# Patient Record
Sex: Female | Born: 1955 | Race: Black or African American | Hispanic: No | Marital: Single | Smoking: Current some day smoker
Health system: Southern US, Community
[De-identification: ages and names within clinical notes are randomized; demographics above are authoritative.]

## PROBLEM LIST (undated history)

## (undated) DIAGNOSIS — B1920 Unspecified viral hepatitis C without hepatic coma: Secondary | ICD-10-CM

## (undated) DIAGNOSIS — F209 Schizophrenia, unspecified: Secondary | ICD-10-CM

## (undated) DIAGNOSIS — B20 Human immunodeficiency virus [HIV] disease: Secondary | ICD-10-CM

## (undated) DIAGNOSIS — E669 Obesity, unspecified: Secondary | ICD-10-CM

## (undated) DIAGNOSIS — Z21 Asymptomatic human immunodeficiency virus [HIV] infection status: Secondary | ICD-10-CM

---

## 2008-09-03 ENCOUNTER — Emergency Department: Payer: Self-pay | Admitting: Emergency Medicine

## 2009-04-30 ENCOUNTER — Emergency Department: Payer: Self-pay | Admitting: Emergency Medicine

## 2009-05-28 ENCOUNTER — Ambulatory Visit: Payer: Self-pay | Admitting: Family Medicine

## 2009-07-04 ENCOUNTER — Ambulatory Visit: Payer: Self-pay | Admitting: Gastroenterology

## 2010-06-26 ENCOUNTER — Ambulatory Visit: Payer: Self-pay | Admitting: Family Medicine

## 2011-02-13 ENCOUNTER — Emergency Department: Payer: Self-pay | Admitting: Emergency Medicine

## 2011-07-03 ENCOUNTER — Ambulatory Visit: Payer: Self-pay | Admitting: Family Medicine

## 2012-02-09 DIAGNOSIS — Z720 Tobacco use: Secondary | ICD-10-CM | POA: Insufficient documentation

## 2012-02-09 DIAGNOSIS — Z21 Asymptomatic human immunodeficiency virus [HIV] infection status: Secondary | ICD-10-CM | POA: Insufficient documentation

## 2012-03-10 DIAGNOSIS — E669 Obesity, unspecified: Secondary | ICD-10-CM | POA: Insufficient documentation

## 2014-02-05 ENCOUNTER — Ambulatory Visit: Payer: Self-pay | Admitting: Family Medicine

## 2014-03-01 ENCOUNTER — Ambulatory Visit: Payer: Self-pay | Admitting: Family Medicine

## 2014-03-22 DIAGNOSIS — Z79899 Other long term (current) drug therapy: Secondary | ICD-10-CM | POA: Insufficient documentation

## 2014-09-23 ENCOUNTER — Emergency Department: Payer: Self-pay | Admitting: Emergency Medicine

## 2014-09-23 LAB — URINALYSIS, COMPLETE
BILIRUBIN, UR: NEGATIVE
Blood: NEGATIVE
Glucose,UR: NEGATIVE mg/dL (ref 0–75)
Ketone: NEGATIVE
Leukocyte Esterase: NEGATIVE
Nitrite: NEGATIVE
PROTEIN: NEGATIVE
Ph: 6 (ref 4.5–8.0)
Specific Gravity: 1.015 (ref 1.003–1.030)
Squamous Epithelial: 3
WBC UR: 2 /HPF (ref 0–5)

## 2014-09-23 LAB — CBC
HCT: 43.1 % (ref 35.0–47.0)
HGB: 13.9 g/dL (ref 12.0–16.0)
MCH: 31.1 pg (ref 26.0–34.0)
MCHC: 32.3 g/dL (ref 32.0–36.0)
MCV: 96 fL (ref 80–100)
Platelet: 186 10*3/uL (ref 150–440)
RBC: 4.48 10*6/uL (ref 3.80–5.20)
RDW: 12.8 % (ref 11.5–14.5)
WBC: 7.8 10*3/uL (ref 3.6–11.0)

## 2014-09-23 LAB — COMPREHENSIVE METABOLIC PANEL
ALT: 24 U/L
ANION GAP: 10 (ref 7–16)
AST: 25 U/L (ref 15–37)
Albumin: 3.5 g/dL (ref 3.4–5.0)
Alkaline Phosphatase: 92 U/L
BILIRUBIN TOTAL: 0.1 mg/dL — AB (ref 0.2–1.0)
BUN: 11 mg/dL (ref 7–18)
CHLORIDE: 106 mmol/L (ref 98–107)
CO2: 26 mmol/L (ref 21–32)
CREATININE: 0.9 mg/dL (ref 0.60–1.30)
Calcium, Total: 9.8 mg/dL (ref 8.5–10.1)
Glucose: 119 mg/dL — ABNORMAL HIGH (ref 65–99)
Osmolality: 284 (ref 275–301)
POTASSIUM: 4 mmol/L (ref 3.5–5.1)
Sodium: 142 mmol/L (ref 136–145)
TOTAL PROTEIN: 7.7 g/dL (ref 6.4–8.2)

## 2014-09-23 LAB — LIPASE, BLOOD: Lipase: 85 U/L (ref 73–393)

## 2015-06-13 ENCOUNTER — Other Ambulatory Visit: Payer: Self-pay | Admitting: Family Medicine

## 2015-06-13 ENCOUNTER — Ambulatory Visit
Admission: RE | Admit: 2015-06-13 | Discharge: 2015-06-13 | Disposition: A | Discharge status: Home / Self Care | Payer: Medicare Other | Source: Ambulatory Visit | Attending: Family Medicine | Admitting: Family Medicine

## 2015-06-13 DIAGNOSIS — R918 Other nonspecific abnormal finding of lung field: Secondary | ICD-10-CM

## 2015-06-13 DIAGNOSIS — F17200 Nicotine dependence, unspecified, uncomplicated: Secondary | ICD-10-CM | POA: Diagnosis not present

## 2015-12-18 ENCOUNTER — Other Ambulatory Visit: Payer: Self-pay | Admitting: Preventative Medicine

## 2015-12-18 DIAGNOSIS — Z1231 Encounter for screening mammogram for malignant neoplasm of breast: Secondary | ICD-10-CM

## 2016-01-01 ENCOUNTER — Other Ambulatory Visit: Payer: Self-pay | Admitting: Preventative Medicine

## 2016-01-01 ENCOUNTER — Ambulatory Visit
Admission: RE | Admit: 2016-01-01 | Discharge: 2016-01-01 | Disposition: A | Discharge status: Home / Self Care | Payer: Medicare Other | Source: Ambulatory Visit | Attending: Preventative Medicine | Admitting: Preventative Medicine

## 2016-01-01 DIAGNOSIS — R918 Other nonspecific abnormal finding of lung field: Secondary | ICD-10-CM

## 2016-01-01 DIAGNOSIS — Z1231 Encounter for screening mammogram for malignant neoplasm of breast: Secondary | ICD-10-CM

## 2016-01-09 ENCOUNTER — Other Ambulatory Visit: Payer: Self-pay | Admitting: Preventative Medicine

## 2016-01-09 DIAGNOSIS — R928 Other abnormal and inconclusive findings on diagnostic imaging of breast: Secondary | ICD-10-CM

## 2016-01-29 ENCOUNTER — Ambulatory Visit
Admission: RE | Admit: 2016-01-29 | Discharge: 2016-01-29 | Disposition: A | Discharge status: Home / Self Care | Payer: Medicare Other | Source: Ambulatory Visit | Attending: Preventative Medicine | Admitting: Preventative Medicine

## 2016-01-29 ENCOUNTER — Other Ambulatory Visit: Payer: Self-pay | Admitting: Preventative Medicine

## 2016-01-29 DIAGNOSIS — R928 Other abnormal and inconclusive findings on diagnostic imaging of breast: Secondary | ICD-10-CM | POA: Insufficient documentation

## 2016-05-28 DIAGNOSIS — Z9189 Other specified personal risk factors, not elsewhere classified: Secondary | ICD-10-CM | POA: Insufficient documentation

## 2016-07-15 ENCOUNTER — Emergency Department
Admission: EM | Admit: 2016-07-15 | Discharge: 2016-07-15 | Disposition: A | Discharge status: Home / Self Care | Payer: Medicare Other | Attending: Emergency Medicine | Admitting: Emergency Medicine

## 2016-07-15 DIAGNOSIS — Z21 Asymptomatic human immunodeficiency virus [HIV] infection status: Secondary | ICD-10-CM | POA: Diagnosis not present

## 2016-07-15 DIAGNOSIS — F419 Anxiety disorder, unspecified: Secondary | ICD-10-CM | POA: Insufficient documentation

## 2016-07-15 DIAGNOSIS — Z79899 Other long term (current) drug therapy: Secondary | ICD-10-CM | POA: Diagnosis not present

## 2016-07-15 DIAGNOSIS — F209 Schizophrenia, unspecified: Secondary | ICD-10-CM

## 2016-07-15 DIAGNOSIS — F200 Paranoid schizophrenia: Secondary | ICD-10-CM

## 2016-07-15 HISTORY — DX: Unspecified viral hepatitis C without hepatic coma: B19.20

## 2016-07-15 HISTORY — DX: Obesity, unspecified: E66.9

## 2016-07-15 HISTORY — DX: Human immunodeficiency virus (HIV) disease: B20

## 2016-07-15 HISTORY — DX: Schizophrenia, unspecified: F20.9

## 2016-07-15 HISTORY — DX: Asymptomatic human immunodeficiency virus (hiv) infection status: Z21

## 2016-07-15 LAB — COMPREHENSIVE METABOLIC PANEL
ALT: 17 U/L (ref 14–54)
AST: 21 U/L (ref 15–41)
Albumin: 4 g/dL (ref 3.5–5.0)
Alkaline Phosphatase: 60 U/L (ref 38–126)
Anion gap: 7 (ref 5–15)
BUN: 15 mg/dL (ref 6–20)
CALCIUM: 10.3 mg/dL (ref 8.9–10.3)
CHLORIDE: 106 mmol/L (ref 101–111)
CO2: 24 mmol/L (ref 22–32)
CREATININE: 0.85 mg/dL (ref 0.44–1.00)
GFR calc non Af Amer: >60 mL/min (ref >60)
Glucose, Bld: 92 mg/dL (ref 65–99)
POTASSIUM: 4.3 mmol/L (ref 3.5–5.1)
SODIUM: 137 mmol/L (ref 135–145)
TOTAL PROTEIN: 7.4 g/dL (ref 6.5–8.1)
Total Bilirubin: 0.2 mg/dL — ABNORMAL LOW (ref 0.3–1.2)

## 2016-07-15 LAB — CBC
HCT: 42.6 % (ref 35.0–47.0)
Hemoglobin: 14.4 g/dL (ref 12.0–16.0)
MCH: 31.5 pg (ref 26.0–34.0)
MCHC: 33.7 g/dL (ref 32.0–36.0)
MCV: 93.6 fL (ref 80.0–100.0)
PLATELETS: 200 10*3/uL (ref 150–440)
RBC: 4.55 MIL/uL (ref 3.80–5.20)
RDW: 13 % (ref 11.5–14.5)
WBC: 6.8 10*3/uL (ref 3.6–11.0)

## 2016-07-15 LAB — URINALYSIS COMPLETE WITH MICROSCOPIC (ARMC ONLY)
Bilirubin Urine: NEGATIVE
GLUCOSE, UA: NEGATIVE mg/dL
Hgb urine dipstick: NEGATIVE
KETONES UR: NEGATIVE mg/dL
Leukocytes, UA: NEGATIVE
Nitrite: NEGATIVE
PROTEIN: NEGATIVE mg/dL
Specific Gravity, Urine: 1.018 (ref 1.005–1.030)
pH: 5 (ref 5.0–8.0)

## 2016-07-15 LAB — URINE DRUG SCREEN, QUALITATIVE (ARMC ONLY)
Amphetamines, Ur Screen: NOT DETECTED
BARBITURATES, UR SCREEN: NOT DETECTED
Benzodiazepine, Ur Scrn: NOT DETECTED
CANNABINOID 50 NG, UR ~~LOC~~: NOT DETECTED
Cocaine Metabolite,Ur ~~LOC~~: NOT DETECTED
MDMA (ECSTASY) UR SCREEN: NOT DETECTED
Methadone Scn, Ur: NOT DETECTED
Opiate, Ur Screen: NOT DETECTED
PHENCYCLIDINE (PCP) UR S: NOT DETECTED
Tricyclic, Ur Screen: POSITIVE — AB

## 2016-07-15 LAB — ACETAMINOPHEN LEVEL: Acetaminophen (Tylenol), Serum: <10 ug/mL — ABNORMAL LOW (ref 10–30)

## 2016-07-15 LAB — TSH: TSH: 2.221 u[IU]/mL (ref 0.350–4.500)

## 2016-07-15 LAB — SALICYLATE LEVEL

## 2016-07-15 LAB — ETHANOL

## 2016-07-15 LAB — VALPROIC ACID LEVEL: VALPROIC ACID LVL: 69 ug/mL (ref 50.0–100.0)
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
## 2016-07-15 NOTE — ED Notes (Signed)
Pt given lunch tray.

## 2016-07-15 NOTE — ED Notes (Signed)
Patient resting with eyes closed, she has discharge orders. Patient is safe.

## 2016-07-15 NOTE — ED Notes (Signed)
Patient with discharge instructions,she is assisted, transported back per group home employee, He signed her discharge paper chart, Patient's belongings given to her, she dressed her self, patient was happy to be going back to group home.No signs of distress.
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
## 2016-07-15 NOTE — Discharge Instructions (Signed)
Please follow up with her primary care physician. Return to the ED if he has any additional concerns or feels unsafe.

## 2016-07-15 NOTE — ED Provider Notes (Signed)
-----------------------------------------   4:53 PM on 07/15/2016 -----------------------------------------  Discussed case with Dr. Toni Amendlapacs of psychiatry who is personally evaluated the patient and feels medically stable and does not appear to have any indication for psychiatric inpatient management at this time. Patient denies any SI or Hi.  Currently at her baseline. Currently requesting discharge home. Will contact group home to arrange for transfer back to her facility.   Melanie EddyPatrick Dejanay Wamboldt, MD 07/15/16 [id number]

## 2016-07-15 NOTE — Progress Notes (Addendum)
[facility] CSW contacted facility at [phone] and spoke with Caryn BeeKevin. CSW stated that pt is currently in the ED and will likely be discharged soon. Caryn BeeKevin states facility will be able to pick pt up from hospital around 5pm.  Jonathon JordanLynn B Desta Bujak, MSW, [place] [phone]

## 2016-07-22 ENCOUNTER — Emergency Department
Admission: EM | Admit: 2016-07-22 | Discharge: 2016-07-22 | Disposition: A | Discharge status: Home / Self Care | Payer: Medicare Other | Attending: Emergency Medicine | Admitting: Emergency Medicine

## 2016-07-22 DIAGNOSIS — R45851 Suicidal ideations: Secondary | ICD-10-CM | POA: Insufficient documentation

## 2016-07-22 DIAGNOSIS — F329 Major depressive disorder, single episode, unspecified: Secondary | ICD-10-CM | POA: Diagnosis present

## 2016-07-22 DIAGNOSIS — F209 Schizophrenia, unspecified: Secondary | ICD-10-CM | POA: Diagnosis present

## 2016-07-22 DIAGNOSIS — R450 Nervousness: Secondary | ICD-10-CM | POA: Insufficient documentation

## 2016-07-22 DIAGNOSIS — F17210 Nicotine dependence, cigarettes, uncomplicated: Secondary | ICD-10-CM | POA: Insufficient documentation

## 2016-07-22 DIAGNOSIS — Z21 Asymptomatic human immunodeficiency virus [HIV] infection status: Secondary | ICD-10-CM | POA: Diagnosis not present

## 2016-07-22 DIAGNOSIS — F201 Disorganized schizophrenia: Secondary | ICD-10-CM

## 2016-07-22 DIAGNOSIS — Z791 Long term (current) use of non-steroidal anti-inflammatories (NSAID): Secondary | ICD-10-CM | POA: Diagnosis not present

## 2016-07-22 DIAGNOSIS — Z79899 Other long term (current) drug therapy: Secondary | ICD-10-CM | POA: Diagnosis not present

## 2016-07-22 DIAGNOSIS — IMO0001 Reserved for inherently not codable concepts without codable children: Secondary | ICD-10-CM

## 2016-07-22 LAB — CBC
HCT: 42.1 % (ref 35.0–47.0)
HEMOGLOBIN: 14.1 g/dL (ref 12.0–16.0)
MCH: 31.1 pg (ref 26.0–34.0)
MCHC: 33.6 g/dL (ref 32.0–36.0)
MCV: 92.6 fL (ref 80.0–100.0)
PLATELETS: 176 10*3/uL (ref 150–440)
RBC: 4.55 MIL/uL (ref 3.80–5.20)
RDW: 12.9 % (ref 11.5–14.5)
WBC: 6.3 10*3/uL (ref 3.6–11.0)

## 2016-07-22 LAB — COMPREHENSIVE METABOLIC PANEL
ALT: 17 U/L (ref 14–54)
ANION GAP: 5 (ref 5–15)
AST: 21 U/L (ref 15–41)
Albumin: 4.1 g/dL (ref 3.5–5.0)
Alkaline Phosphatase: 60 U/L (ref 38–126)
BUN: 12 mg/dL (ref 6–20)
CHLORIDE: 109 mmol/L (ref 101–111)
CO2: 25 mmol/L (ref 22–32)
CREATININE: 0.8 mg/dL (ref 0.44–1.00)
Calcium: 10.1 mg/dL (ref 8.9–10.3)
Glucose, Bld: 78 mg/dL (ref 65–99)
POTASSIUM: 4.2 mmol/L (ref 3.5–5.1)
SODIUM: 139 mmol/L (ref 135–145)
Total Bilirubin: 0.4 mg/dL (ref 0.3–1.2)
Total Protein: 7.5 g/dL (ref 6.5–8.1)

## 2016-07-22 LAB — SALICYLATE LEVEL

## 2016-07-22 LAB — ACETAMINOPHEN LEVEL

## 2016-07-22 LAB — URINE DRUG SCREEN, QUALITATIVE (ARMC ONLY)
Amphetamines, Ur Screen: NOT DETECTED
BARBITURATES, UR SCREEN: NOT DETECTED
Benzodiazepine, Ur Scrn: NOT DETECTED
CANNABINOID 50 NG, UR ~~LOC~~: NOT DETECTED
Cocaine Metabolite,Ur ~~LOC~~: NOT DETECTED
MDMA (ECSTASY) UR SCREEN: NOT DETECTED
METHADONE SCREEN, URINE: NOT DETECTED
Opiate, Ur Screen: NOT DETECTED
Phencyclidine (PCP) Ur S: NOT DETECTED
TRICYCLIC, UR SCREEN: NOT DETECTED

## 2016-07-22 LAB — VALPROIC ACID LEVEL: VALPROIC ACID LVL: 67 ug/mL (ref 50.0–100.0)

## 2016-07-22 LAB — ETHANOL

## 2016-07-22 MED ORDER — QUETIAPINE FUMARATE 400 MG PO TABS: 400.0000 mg | ORAL_TABLET | Freq: Every day | ORAL | 0 refills | Status: AC

## 2016-07-22 NOTE — Consult Note (Signed)
[facility] Consult   Reason for Consult:  Consult for this 60 year old woman with a history of schizophrenia who came voluntarily to the emergency room Referring Physician:  Cinda Quest Patient Identification: Melanie Hahn MRN:  [id number] Principal Diagnosis: Schizophrenia [facility]) Diagnosis:   Patient Active Problem List   Diagnosis Date Noted  . Schizophrenia ([place]) [F20.9] 07/15/2016    Total Time spent with patient: 45 minutes  Subjective:   Melanie Hahn is a 60 y.o. female patient admitted with "I'm worried".  HPI:  Patient with a history of schizophrenia. She was here just about a week ago. Comes back voluntarily. She says she is worried and her nerves are upset and she is not sleeping well. Also says she is not eating well. She wants me to give her some medication for her nerves. She denies that she's having any suicidal or homicidal thoughts. She denies that she's having any auditory hallucinations. She says that she is not weeping well at night. Heels nervous a lot of the time. Looking at her medication listed appear that they recently increase some of her medicine so maybe she is been complaining of that at home. Denies that she's been abusing drugs or alcohol.  Medical history: History of HIV positive and takes her medicine regularly.  Social history: Lives in a group home. Has been there for several years. Has sisters that she still stays in touch with time to time.  Substance abuse history: Not currently using any drugs or alcohol.  Past Psychiatric History: Past history of schizophrenia. She is a little bit labile today and I'm wondering if she might have more of a schizoaffective presentation. She denies any past suicidal behavior. She has been on Prolixin and Cogentin and Seroquel. It looks like they've recently increased the Seroquel and trazodone but DC'd the Prolixin.  Risk to Self: Is patient at risk for suicide?: No Risk to Others:   Prior Inpatient  Therapy:   Prior Outpatient Therapy:    Past Medical History:  Past Medical History:  Diagnosis Date  . Hepatitis C   . HIV (human immunodeficiency virus infection) ([place])   . Obesity   . Schizophrenia ([place])    History reviewed. No pertinent surgical history. Family History:  Family History  Problem Relation Age of Onset  . Breast cancer Neg Hx    Family Psychiatric  History: Does not know of any family history of mental illness Social History:  History  Alcohol Use No     History  Drug Use No    Social History   Social History  . Marital status: Single    Spouse name: N/A  . Number of children: N/A  . Years of education: N/A   Social History Main Topics  . Smoking status: Current Some Day Smoker    Types: Cigarettes  . Smokeless tobacco: Never Used  . Alcohol use No  . Drug use: No  . Sexual activity: Not Asked   Other Topics Concern  . None   Social History Narrative  . None   Additional Social History:    Allergies:   Allergies  Allergen Reactions  . Penicillins Other (See Comments)    Has patient had a PCN reaction causing immediate rash, facial/tongue/throat swelling, SOB or lightheadedness with hypotension: unknown Has patient had a PCN reaction causing severe rash involving mucus membranes or skin necrosis: unknown Has patient had a PCN reaction that required hospitalization: unknown Has patient had a PCN reaction occurring within the last 10 years:  unknown If all of the above answers are "NO", then may proceed with Cephalosporin use.     Labs:  Results for orders placed or performed during the hospital encounter of 07/22/16 (from the past 48 hour(s))  Comprehensive metabolic panel     Status: None   Collection Time: 07/22/16 12:49 PM  Result Value Ref Range   Sodium 139 135 - 145 mmol/L   Potassium 4.2 3.5 - 5.1 mmol/L   Chloride 109 101 - 111 mmol/L   CO2 25 22 - 32 mmol/L   Glucose, Bld 78 65 - 99 mg/dL   BUN 12 6 - 20 mg/dL    Creatinine, Ser 0.80 0.44 - 1.00 mg/dL   Calcium 10.1 8.9 - 10.3 mg/dL   Total Protein 7.5 6.5 - 8.1 g/dL   Albumin 4.1 3.5 - 5.0 g/dL   AST 21 15 - 41 U/L   ALT 17 14 - 54 U/L   Alkaline Phosphatase 60 38 - 126 U/L   Total Bilirubin 0.4 0.3 - 1.2 mg/dL   GFR calc non Af Amer >60 >60 mL/min   GFR calc Af Amer >60 >60 mL/min    Comment: (NOTE) The eGFR has been calculated using the CKD EPI equation. This calculation has not been validated in all clinical situations. eGFR's persistently <60 mL/min signify possible Chronic Kidney Disease.    Anion gap 5 5 - 15  Ethanol     Status: None   Collection Time: 07/22/16 12:49 PM  Result Value Ref Range   Alcohol, Ethyl (B) <5 <5 mg/dL    Comment:        LOWEST DETECTABLE LIMIT FOR SERUM ALCOHOL IS 5 mg/dL FOR MEDICAL PURPOSES ONLY   Salicylate level     Status: None   Collection Time: 07/22/16 12:49 PM  Result Value Ref Range   Salicylate Lvl <8.7 2.8 - 30.0 mg/dL  Acetaminophen level     Status: Abnormal   Collection Time: 07/22/16 12:49 PM  Result Value Ref Range   Acetaminophen (Tylenol), Serum <10 (L) 10 - 30 ug/mL    Comment:        THERAPEUTIC CONCENTRATIONS VARY SIGNIFICANTLY. A RANGE OF 10-30 ug/mL MAY BE AN EFFECTIVE CONCENTRATION FOR MANY PATIENTS. HOWEVER, SOME ARE BEST TREATED AT CONCENTRATIONS OUTSIDE THIS RANGE. ACETAMINOPHEN CONCENTRATIONS >150 ug/mL AT 4 HOURS AFTER INGESTION AND >50 ug/mL AT 12 HOURS AFTER INGESTION ARE OFTEN ASSOCIATED WITH TOXIC REACTIONS.   cbc     Status: None   Collection Time: 07/22/16 12:49 PM  Result Value Ref Range   WBC 6.3 3.6 - 11.0 K/uL   RBC 4.55 3.80 - 5.20 MIL/uL   Hemoglobin 14.1 12.0 - 16.0 g/dL   HCT 42.1 35.0 - 47.0 %   MCV 92.6 80.0 - 100.0 fL   MCH 31.1 26.0 - 34.0 pg   MCHC 33.6 32.0 - 36.0 g/dL   RDW 12.9 11.5 - 14.5 %   Platelets 176 150 - 440 K/uL  Urine Drug Screen, Qualitative     Status: None   Collection Time: 07/22/16 12:49 PM  Result Value Ref Range    Tricyclic, Ur Screen NONE DETECTED NONE DETECTED   Amphetamines, Ur Screen NONE DETECTED NONE DETECTED   MDMA (Ecstasy)Ur Screen NONE DETECTED NONE DETECTED   Cocaine Metabolite,Ur [place] NONE DETECTED NONE DETECTED   Opiate, Ur Screen NONE DETECTED NONE DETECTED   Phencyclidine (PCP) Ur S NONE DETECTED NONE DETECTED   Cannabinoid 50 Ng, Ur [place] NONE DETECTED NONE DETECTED  Barbiturates, Ur Screen NONE DETECTED NONE DETECTED   Benzodiazepine, Ur Scrn NONE DETECTED NONE DETECTED   Methadone Scn, Ur NONE DETECTED NONE DETECTED    Comment: (NOTE) 786  Tricyclics, urine               Cutoff 1000 ng/mL 200  Amphetamines, urine             Cutoff 1000 ng/mL 300  MDMA (Ecstasy), urine           Cutoff 500 ng/mL 400  Cocaine Metabolite, urine       Cutoff 300 ng/mL 500  Opiate, urine                   Cutoff 300 ng/mL 600  Phencyclidine (PCP), urine      Cutoff 25 ng/mL 700  Cannabinoid, urine              Cutoff 50 ng/mL 800  Barbiturates, urine             Cutoff 200 ng/mL 900  Benzodiazepine, urine           Cutoff 200 ng/mL 1000 Methadone, urine                Cutoff 300 ng/mL 1100 1200 The urine drug screen provides only a preliminary, unconfirmed 1300 analytical test result and should not be used for non-medical 1400 purposes. Clinical consideration and professional judgment should 1500 be applied to any positive drug screen result due to possible 1600 interfering substances. A more specific alternate chemical method 1700 must be used in order to obtain a confirmed analytical result.  1800 Gas chromato graphy / mass spectrometry (GC/MS) is the preferred 1900 confirmatory method.   Valproic acid level     Status: None   Collection Time: 07/22/16 12:49 PM  Result Value Ref Range   Valproic Acid Lvl 67 50.0 - 100.0 ug/mL    No current facility-administered medications for this encounter.    Current Outpatient Prescriptions  Medication Sig Dispense Refill  . albuterol (PROVENTIL  HFA;VENTOLIN HFA) 108 (90 Base) MCG/ACT inhaler Inhale 2 puffs into the lungs 3 (three) times daily as needed for wheezing or shortness of breath.    . benztropine (COGENTIN) 1 MG tablet Take 1 mg by mouth 2 (two) times daily.    . cholecalciferol (VITAMIN D) 1000 units tablet Take 1,000 Units by mouth daily.    . clonazePAM (KLONOPIN) 0.5 MG tablet Take 0.5 mg by mouth 2 (two) times daily.    Marland Kitchen darifenacin (ENABLEX) 7.5 MG 24 hr tablet Take 7.5 mg by mouth daily.    . divalproex (DEPAKOTE ER) 500 MG 24 hr tablet Take 500 mg by mouth 2 (two) times daily.    Marland Kitchen efavirenz-emtricitabine-tenofovir (ATRIPLA) 600-200-300 MG tablet Take 1 tablet by mouth daily.    . hydroxypropyl methylcellulose / hypromellose (ISOPTO TEARS / GONIOVISC) 2.5 % ophthalmic solution 1-2 drops as needed for dry eyes.    Marland Kitchen ibuprofen (ADVIL,MOTRIN) 600 MG tablet Take 600 mg by mouth every 6 (six) hours as needed.    Marland Kitchen ketoconazole (NIZORAL) 2 % cream Apply 1 application topically 2 (two) times daily as needed for irritation.    . traZODone (DESYREL) 150 MG tablet Take 150 mg by mouth at bedtime.    . Vitamin D, Ergocalciferol, (DRISDOL) 50000 units CAPS capsule Take 50,000 Units by mouth every 7 (seven) days.    . hydroxypropyl methylcellulose / hypromellose (ISOPTO TEARS / GONIOVISC) 2.5 % ophthalmic  solution 1-2 drops as needed for dry eyes.    Marland Kitchen QUEtiapine (SEROQUEL) 400 MG tablet Take 1 tablet (400 mg total) by mouth at bedtime. 30 tablet 0    Musculoskeletal: Strength & Muscle Tone: within normal limits Gait & Station: normal Patient leans: N/A  Psychiatric Specialty Exam: Physical Exam  Nursing note and vitals reviewed. Constitutional: She appears well-developed and well-nourished.  HENT:  Head: Normocephalic and atraumatic.  Eyes: Conjunctivae are normal. Pupils are equal, round, and reactive to light.  Neck: Normal range of motion.  Cardiovascular: Normal heart sounds.   Respiratory: Effort normal. No  respiratory distress.  GI: Soft.  Musculoskeletal: Normal range of motion.  Neurological: She is alert.  Skin: Skin is warm and dry.  Psychiatric: Her speech is normal and behavior is normal. Her affect is labile. She expresses impulsivity. She expresses suicidal ideation. She expresses no homicidal ideation.    Review of Systems  Constitutional: Negative.   HENT: Negative.   Eyes: Negative.   Respiratory: Negative.   Cardiovascular: Negative.   Gastrointestinal: Negative.   Musculoskeletal: Negative.   Skin: Negative.   Neurological: Negative.   Psychiatric/Behavioral: Negative for depression, hallucinations, memory loss, substance abuse and suicidal ideas. The patient is nervous/anxious and has insomnia.     Blood pressure 127/80, pulse 89, temperature 98 F (36.7 C), temperature source Oral, resp. rate 18, height _0  (1.676 m), weight 103.9 kg (229 lb), SpO2 98 %.Body mass index is 36.96 kg/m.  General Appearance: Casual  Eye Contact:  Good  Speech:  Pressured  Volume:  Increased  Mood:  Anxious  Affect:  Congruent  Thought Process:  Disorganized  Orientation:  Full (Time, Place, and Person)  Thought Content:  Rumination  Suicidal Thoughts:  No  Homicidal Thoughts:  No  Memory:  Immediate;   Good Recent;   Good Remote;   Fair  Judgement:  Fair  Insight:  Fair  Psychomotor Activity:  Normal  Concentration:  Concentration: Fair  Recall:  [employer] of Knowledge:  Fair  Language:  Fair  Akathisia:  No  Handed:  Right  AIMS (if indicated):     Assets:  Communication Skills Desire for Improvement Housing Resilience  ADL's:  Intact  Cognition:  WNL  Sleep:        Treatment Plan Summary: Medication management and Plan 60 year old woman with schizophrenia or possibly schizoaffective disorder. Looking a little bit more euphoric. Feeling nervous. Not sleeping well. At the same time she is not suicidal or homicidal. She does not meet commitment criteria. She does  not really need to be in the psychiatric hospital. She is asking for more medication. I am going to increase her Seroquel up to 400 mg at night. I have written a prescription for that and discussed it with the emergency room doctor. Patient can be discharged from the emergency room back to her group home and follow-up with her usual outpatient provider.  Disposition: Patient does not meet criteria for psychiatric inpatient admission.  Alethia Berthold, MD 07/22/2016 5:57 PM

## 2016-07-22 NOTE — ED Triage Notes (Addendum)
Pt is [facility] with c/o pt having depression from remembering family who has passed away.. Denies SI.Marland Kitchen. Caregiver states her psychiatrist and mobile crisis referred the pt to the ED.. [phone]. [phone] or [phone] for contact at care home.

## 2016-07-22 NOTE — ED Provider Notes (Signed)
[facility] Emergency Department Provider Note   ____________________________________________   First MD Initiated Contact with Patient 07/22/16 1512     (approximate)  I have reviewed the triage vital signs and the nursing notes.   HISTORY  Chief Complaint Depression    HPI Melanie Hahn is a 60 y.o. female who was recently seen here for shakiness and decreased sleepiness. She says now she was at her care home and was thinking about her dead mother and number of other dead family members and has become depressed. She Also asked for medicine for racing thoughts.This depression apparently started in the last week or so. She cannot tell me how severe it is but says she is not homicidal or suicidal  Past Medical History:  Diagnosis Date  . Hepatitis C   . HIV (human immunodeficiency virus infection) ([facility])   . Obesity   . Schizophrenia [facility])     Patient Active Problem List   Diagnosis Date Noted  . Schizophrenia ([facility]) 07/15/2016    History reviewed. No pertinent surgical history.  Prior to Admission medications   Medication Sig Start Date End Date Taking? Authorizing Provider  albuterol (PROVENTIL HFA;VENTOLIN HFA) 108 (90 Base) MCG/ACT inhaler Inhale 2 puffs into the lungs 3 (three) times daily as needed for wheezing or shortness of breath.   Yes Historical Provider, MD  benztropine (COGENTIN) 1 MG tablet Take 1 mg by mouth 2 (two) times daily.   Yes Historical Provider, MD  cholecalciferol (VITAMIN D) 1000 units tablet Take 1,000 Units by mouth daily.   Yes Historical Provider, MD  clonazePAM (KLONOPIN) 0.5 MG tablet Take 0.5 mg by mouth 2 (two) times daily.   Yes Historical Provider, MD  darifenacin (ENABLEX) 7.5 MG 24 hr tablet Take 7.5 mg by mouth daily.   Yes Historical Provider, MD  divalproex (DEPAKOTE ER) 500 MG 24 hr tablet Take 500 mg by mouth 2 (two) times daily.   Yes Historical Provider, MD  efavirenz-emtricitabine-tenofovir (ATRIPLA)  600-200-300 MG tablet Take 1 tablet by mouth daily.   Yes Historical Provider, MD  hydroxypropyl methylcellulose / hypromellose (ISOPTO TEARS / GONIOVISC) 2.5 % ophthalmic solution 1-2 drops as needed for dry eyes.   Yes Historical Provider, MD  ibuprofen (ADVIL,MOTRIN) 600 MG tablet Take 600 mg by mouth every 6 (six) hours as needed.   Yes Historical Provider, MD  ketoconazole (NIZORAL) 2 % cream Apply 1 application topically 2 (two) times daily as needed for irritation.   Yes Historical Provider, MD  traZODone (DESYREL) 150 MG tablet Take 150 mg by mouth at bedtime.   Yes Historical Provider, MD  Vitamin D, Ergocalciferol, (DRISDOL) 50000 units CAPS capsule Take 50,000 Units by mouth every 7 (seven) days.   Yes Historical Provider, MD  hydroxypropyl methylcellulose / hypromellose (ISOPTO TEARS / GONIOVISC) 2.5 % ophthalmic solution 1-2 drops as needed for dry eyes.    Historical Provider, MD  QUEtiapine (SEROQUEL) 400 MG tablet Take 1 tablet (400 mg total) by mouth at bedtime. 07/22/16   Audery AmelJohn T Clapacs, MD    Allergies Penicillins  Family History  Problem Relation Age of Onset  . Breast cancer Neg Hx     Social History Social History  Substance Use Topics  . Smoking status: Current Some Day Smoker    Types: Cigarettes  . Smokeless tobacco: Never Used  . Alcohol use No    Review of Systems Constitutional: No fever/chills Eyes: No visual changes. ENT: No sore throat. Cardiovascular: Denies chest pain. Respiratory:  Denies shortness of breath. Gastrointestinal: No abdominal pain.  No nausea, no vomiting.  No diarrhea.  No constipation. Genitourinary: Negative for dysuria. Musculoskeletal: Negative for back pain. Skin: Negative for rash. Neurological: Negative for headaches, focal weakness or numbness.  10-point ROS otherwise negative.  ____________________________________________   PHYSICAL EXAM:  VITAL SIGNS: ED Triage Vitals [07/22/16 1245]  Enc Vitals Group     BP  115/65     Pulse Rate 87     Resp 17     Temp 98.3 F (36.8 C)     Temp Source Oral     SpO2 98 %     Weight 229 lb (103.9 kg)     Height 5\' 6"  (1.676 m)     Head Circumference      Peak Flow      Pain Score 7     Pain Loc      Pain Edu?      Excl. in GC?     Constitutional: Alert and oriented. Well appearing and in no acute distress. Eyes: Conjunctivae are normal. PERRL. EOMI. Head: Atraumatic. Nose: No congestion/rhinnorhea. Mouth/Throat: Mucous membranes are moist.  Oropharynx non-erythematous. Neck: No stridor. Cardiovascular: Normal rate, regular rhythm. Grossly normal heart sounds.  Good peripheral circulation. Respiratory: Normal respiratory effort.  No retractions. Lungs CTAB. Gastrointestinal: Soft and nontender. No distention. No abdominal bruits. No CVA tenderness. Musculoskeletal: No lower extremity tenderness nor edema.  No joint effusions. Neurologic:  Normal speech and language. No gross focal neurologic deficits are appreciated.  Skin:  Skin is warm, dry and intact. No rash noted.   ____________________________________________   LABS (all labs ordered are listed, but only abnormal results are displayed)  Labs Reviewed  ACETAMINOPHEN LEVEL - Abnormal; Notable for the following:       Result Value   Acetaminophen (Tylenol), Serum <10 (*)    All other components within normal limits  COMPREHENSIVE METABOLIC PANEL  ETHANOL  SALICYLATE LEVEL  CBC  URINE DRUG SCREEN, QUALITATIVE ([facility] ONLY)  VALPROIC ACID LEVEL   ____________________________________________  EKG   ____________________________________________  RADIOLOGY   ____________________________________________   PROCEDURES  Procedure(s) performed:   Procedures  Critical Care performed:  ____________________________________________   INITIAL IMPRESSION / ASSESSMENT AND PLAN / ED COURSE  Pertinent labs & imaging results that were available during my care of the patient were  reviewed by me and considered in my medical decision making (see chart for details).    Clinical Course     ____________________________________________   FINAL CLINICAL IMPRESSION(S) / ED DIAGNOSES  Final diagnoses:  Nerves   Patient has some symptoms of depression and also some racing thoughts. Her chief complaint is "nerves" Dr. Mat Carnelay packs we will increase her Seroquel she will follow-up with her regular doctor   NEW MEDICATIONS STARTED DURING THIS VISIT:  Current Discharge Medication List       Note:  This document was prepared using Dragon voice recognition software and may include unintentional dictation errors.    Arnaldo NatalPaul F Adolphe Fortunato, MD 07/22/16 [phone]

## 2016-07-22 NOTE — ED Notes (Signed)
Pt given clothes to dress out

## 2016-07-22 NOTE — ED Notes (Signed)
Pts carehome contact and informed of DC.  ETA 30 min.

## 2016-07-22 NOTE — Discharge Instructions (Signed)
Increase the Seroquel to 400 mg at bed time. Follow up with your doctor. Return as needed.

## 2017-10-09 ENCOUNTER — Inpatient Hospital Stay
Admission: EM | Admit: 2017-10-09 | Discharge: 2017-10-14 | DRG: 439 | Disposition: A | Discharge status: Skilled Nursing Facility | Payer: Medicare Other | Attending: Internal Medicine | Admitting: Internal Medicine

## 2017-10-09 ENCOUNTER — Emergency Department: Payer: Medicare Other

## 2017-10-09 ENCOUNTER — Encounter: Payer: Self-pay | Admitting: Emergency Medicine

## 2017-10-09 DIAGNOSIS — Z79899 Other long term (current) drug therapy: Secondary | ICD-10-CM | POA: Diagnosis not present

## 2017-10-09 DIAGNOSIS — F17210 Nicotine dependence, cigarettes, uncomplicated: Secondary | ICD-10-CM | POA: Diagnosis present

## 2017-10-09 DIAGNOSIS — E669 Obesity, unspecified: Secondary | ICD-10-CM | POA: Diagnosis present

## 2017-10-09 DIAGNOSIS — N179 Acute kidney failure, unspecified: Secondary | ICD-10-CM | POA: Diagnosis present

## 2017-10-09 DIAGNOSIS — B1920 Unspecified viral hepatitis C without hepatic coma: Secondary | ICD-10-CM | POA: Diagnosis present

## 2017-10-09 DIAGNOSIS — K567 Ileus, unspecified: Secondary | ICD-10-CM

## 2017-10-09 DIAGNOSIS — R109 Unspecified abdominal pain: Secondary | ICD-10-CM | POA: Diagnosis present

## 2017-10-09 DIAGNOSIS — R1114 Bilious vomiting: Secondary | ICD-10-CM

## 2017-10-09 DIAGNOSIS — B20 Human immunodeficiency virus [HIV] disease: Secondary | ICD-10-CM | POA: Diagnosis present

## 2017-10-09 DIAGNOSIS — Z6832 Body mass index (BMI) 32.0-32.9, adult: Secondary | ICD-10-CM

## 2017-10-09 DIAGNOSIS — F209 Schizophrenia, unspecified: Secondary | ICD-10-CM | POA: Diagnosis present

## 2017-10-09 DIAGNOSIS — R6510 Systemic inflammatory response syndrome (SIRS) of non-infectious origin without acute organ dysfunction: Secondary | ICD-10-CM

## 2017-10-09 DIAGNOSIS — K859 Acute pancreatitis without necrosis or infection, unspecified: Principal | ICD-10-CM

## 2017-10-09 DIAGNOSIS — Z21 Asymptomatic human immunodeficiency virus [HIV] infection status: Secondary | ICD-10-CM

## 2017-10-09 DIAGNOSIS — Z880 Allergy status to penicillin: Secondary | ICD-10-CM | POA: Diagnosis not present

## 2017-10-09 DIAGNOSIS — R112 Nausea with vomiting, unspecified: Secondary | ICD-10-CM

## 2017-10-09 DIAGNOSIS — K851 Biliary acute pancreatitis without necrosis or infection: Secondary | ICD-10-CM | POA: Insufficient documentation

## 2017-10-09 LAB — URINALYSIS, ROUTINE W REFLEX MICROSCOPIC
Bacteria, UA: NONE SEEN
Bilirubin Urine: NEGATIVE
GLUCOSE, UA: 150 mg/dL — AB
Hgb urine dipstick: NEGATIVE
KETONES UR: 5 mg/dL — AB
Leukocytes, UA: NEGATIVE
NITRITE: NEGATIVE
PH: 5 (ref 5.0–8.0)
Protein, ur: 100 mg/dL — AB
SPECIFIC GRAVITY, URINE: 1.019 (ref 1.005–1.030)

## 2017-10-09 LAB — COMPREHENSIVE METABOLIC PANEL
ALBUMIN: 2.8 g/dL — AB (ref 3.5–5.0)
ALT: 13 U/L — AB (ref 14–54)
AST: 42 U/L — ABNORMAL HIGH (ref 15–41)
Alkaline Phosphatase: 66 U/L (ref 38–126)
Anion gap: 14 (ref 5–15)
BUN: 24 mg/dL — ABNORMAL HIGH (ref 6–20)
CHLORIDE: 105 mmol/L (ref 101–111)
CO2: 19 mmol/L — AB (ref 22–32)
CREATININE: 1.95 mg/dL — AB (ref 0.44–1.00)
Calcium: 9.8 mg/dL (ref 8.9–10.3)
GFR calc non Af Amer: 27 mL/min — ABNORMAL LOW (ref >60)
GFR, EST AFRICAN AMERICAN: 31 mL/min — AB (ref >60)
GLUCOSE: 153 mg/dL — AB (ref 65–99)
Potassium: 4.1 mmol/L (ref 3.5–5.1)
SODIUM: 138 mmol/L (ref 135–145)
Total Bilirubin: 0.9 mg/dL (ref 0.3–1.2)
Total Protein: 6.4 g/dL — ABNORMAL LOW (ref 6.5–8.1)

## 2017-10-09 LAB — CBC WITH DIFFERENTIAL/PLATELET
BASOS PCT: 0 %
Basophils Absolute: 0 10*3/uL (ref 0–0.1)
EOS ABS: 0 10*3/uL (ref 0–0.7)
EOS PCT: 0 %
HCT: 53.5 % — ABNORMAL HIGH (ref 35.0–47.0)
Hemoglobin: 17.1 g/dL — ABNORMAL HIGH (ref 12.0–16.0)
LYMPHS ABS: 2.6 10*3/uL (ref 1.0–3.6)
Lymphocytes Relative: 13 %
MCH: 32.8 pg (ref 26.0–34.0)
MCHC: 32 g/dL (ref 32.0–36.0)
MCV: 102.6 fL — ABNORMAL HIGH (ref 80.0–100.0)
MONOS PCT: 9 %
Monocytes Absolute: 1.8 10*3/uL — ABNORMAL HIGH (ref 0.2–0.9)
NEUTROS PCT: 78 %
Neutro Abs: 15.7 10*3/uL — ABNORMAL HIGH (ref 1.4–6.5)
PLATELETS: 195 10*3/uL (ref 150–440)
RBC: 5.21 MIL/uL — ABNORMAL HIGH (ref 3.80–5.20)
RDW: 14.4 % (ref 11.5–14.5)
WBC: 20.2 10*3/uL — AB (ref 3.6–11.0)

## 2017-10-09 LAB — LIPASE, BLOOD: Lipase: 432 U/L — ABNORMAL HIGH (ref 11–51)

## 2017-10-09 LAB — LACTIC ACID, PLASMA
LACTIC ACID, VENOUS: 3 mmol/L — AB (ref 0.5–1.9)
Lactic Acid, Venous: 7.2 mmol/L (ref 0.5–1.9)

## 2017-10-09 LAB — TROPONIN I: Troponin I: <0.03 ng/mL (ref <0.03)

## 2017-10-09 MED ORDER — TRAZODONE HCL 100 MG PO TABS
200.0000 mg | ORAL_TABLET | Freq: Every day | ORAL | Status: DC
  Administered 2017-10-09 – 2017-10-13 (×5): 200 mg via ORAL
  Filled 2017-10-09 (×5): qty 2

## 2017-10-09 MED ORDER — DIVALPROEX SODIUM ER 500 MG PO TB24
1000.0000 mg | ORAL_TABLET | Freq: Two times a day (BID) | ORAL | Status: DC
  Administered 2017-10-09 – 2017-10-14 (×10): 1000 mg via ORAL
  Filled 2017-10-09 (×10): qty 2

## 2017-10-09 MED ORDER — LEVOFLOXACIN IN D5W 750 MG/150ML IV SOLN
750.0000 mg | INTRAVENOUS | Status: DC
  Administered 2017-10-11: 750 mg via INTRAVENOUS
  Filled 2017-10-09: qty 150

## 2017-10-09 MED ORDER — PANTOPRAZOLE SODIUM 40 MG IV SOLR
40.0000 mg | Freq: Two times a day (BID) | INTRAVENOUS | Status: DC
  Administered 2017-10-09 – 2017-10-14 (×10): 40 mg via INTRAVENOUS
  Filled 2017-10-09 (×10): qty 40

## 2017-10-09 MED ORDER — ACETAMINOPHEN 650 MG RE SUPP
650.0000 mg | Freq: Four times a day (QID) | RECTAL | Status: DC | PRN
Start: 2017-10-09 — End: 2017-10-14

## 2017-10-09 MED ORDER — EFAVIRENZ-EMTRICITAB-TENOFOVIR 600-200-300 MG PO TABS
1.0000 | ORAL_TABLET | Freq: Every day | ORAL | Status: DC
  Administered 2017-10-09 – 2017-10-10 (×2): 1 via ORAL
  Filled 2017-10-09 (×2): qty 1

## 2017-10-09 MED ORDER — SODIUM CHLORIDE 0.9 % IV BOLUS (SEPSIS)
1000.0000 mL | Freq: Once | INTRAVENOUS | Status: AC
  Administered 2017-10-09: 1000 mL via INTRAVENOUS

## 2017-10-09 MED ORDER — ONDANSETRON HCL 4 MG PO TABS: 4.0000 mg | ORAL_TABLET | Freq: Four times a day (QID) | ORAL | Status: DC | PRN

## 2017-10-09 MED ORDER — IOPAMIDOL (ISOVUE-300) INJECTION 61%: 30.0000 mL | Freq: Once | INTRAVENOUS | Status: DC | PRN

## 2017-10-09 MED ORDER — ONDANSETRON HCL 4 MG/2ML IJ SOLN
INTRAMUSCULAR | Status: AC
  Administered 2017-10-09: 4 mg via INTRAVENOUS
  Filled 2017-10-09: qty 2

## 2017-10-09 MED ORDER — SODIUM CHLORIDE 0.9 % IV BOLUS (SEPSIS): 500.0000 mL | Freq: Once | INTRAVENOUS | Status: DC

## 2017-10-09 MED ORDER — BENZTROPINE MESYLATE 1 MG PO TABS
1.0000 mg | ORAL_TABLET | Freq: Two times a day (BID) | ORAL | Status: DC
  Administered 2017-10-09 – 2017-10-14 (×10): 1 mg via ORAL
  Filled 2017-10-09 (×10): qty 1

## 2017-10-09 MED ORDER — ONDANSETRON HCL 4 MG/2ML IJ SOLN
4.0000 mg | Freq: Once | INTRAMUSCULAR | Status: AC
  Administered 2017-10-09: 4 mg via INTRAVENOUS

## 2017-10-09 MED ORDER — BISACODYL 10 MG RE SUPP: 10.0000 mg | Freq: Every day | RECTAL | Status: DC | PRN

## 2017-10-09 MED ORDER — DEXTROSE 5 % IV SOLN
1.0000 g | Freq: Three times a day (TID) | INTRAVENOUS | Status: DC
  Administered 2017-10-09 – 2017-10-10 (×2): 1 g via INTRAVENOUS
  Filled 2017-10-09 (×4): qty 1

## 2017-10-09 MED ORDER — ACETAMINOPHEN 325 MG PO TABS: 650.0000 mg | ORAL_TABLET | Freq: Four times a day (QID) | ORAL | Status: DC | PRN

## 2017-10-09 MED ORDER — VANCOMYCIN HCL IN DEXTROSE 1-5 GM/200ML-% IV SOLN
1000.0000 mg | Freq: Two times a day (BID) | INTRAVENOUS | Status: DC
  Administered 2017-10-09 – 2017-10-10 (×2): 1000 mg via INTRAVENOUS
  Filled 2017-10-09 (×4): qty 200

## 2017-10-09 MED ORDER — HYDRALAZINE HCL 20 MG/ML IJ SOLN: 10.0000 mg | INTRAMUSCULAR | Status: DC | PRN

## 2017-10-09 MED ORDER — METOPROLOL TARTRATE 5 MG/5ML IV SOLN
2.5000 mg | Freq: Once | INTRAVENOUS | Status: AC
  Administered 2017-10-09: 2.5 mg via INTRAVENOUS
  Filled 2017-10-09: qty 5

## 2017-10-09 MED ORDER — LORAZEPAM 2 MG/ML IJ SOLN
0.5000 mg | INTRAMUSCULAR | Status: DC | PRN
  Administered 2017-10-10 – 2017-10-12 (×2): 0.5 mg via INTRAVENOUS
  Filled 2017-10-09 (×2): qty 1

## 2017-10-09 MED ORDER — VANCOMYCIN HCL IN DEXTROSE 1-5 GM/200ML-% IV SOLN
1000.0000 mg | Freq: Once | INTRAVENOUS | Status: AC
  Administered 2017-10-09: 1000 mg via INTRAVENOUS
  Filled 2017-10-09: qty 200

## 2017-10-09 MED ORDER — HYDROMORPHONE HCL 1 MG/ML IJ SOLN
1.0000 mg | INTRAMUSCULAR | Status: DC | PRN
  Administered 2017-10-09 – 2017-10-10 (×6): 1 mg via INTRAVENOUS
  Filled 2017-10-09 (×7): qty 1

## 2017-10-09 MED ORDER — ONDANSETRON HCL 4 MG/2ML IJ SOLN: 4.0000 mg | Freq: Four times a day (QID) | INTRAMUSCULAR | Status: DC | PRN

## 2017-10-09 MED ORDER — IPRATROPIUM-ALBUTEROL 0.5-2.5 (3) MG/3ML IN SOLN: 3.0000 mL | Freq: Four times a day (QID) | RESPIRATORY_TRACT | Status: DC | PRN

## 2017-10-09 MED ORDER — MORPHINE SULFATE (PF) 4 MG/ML IV SOLN
4.0000 mg | Freq: Once | INTRAVENOUS | Status: AC
  Administered 2017-10-09: 4 mg via INTRAVENOUS

## 2017-10-09 MED ORDER — HEPARIN SODIUM (PORCINE) 5000 UNIT/ML IJ SOLN
5000.0000 [IU] | Freq: Three times a day (TID) | INTRAMUSCULAR | Status: DC
  Administered 2017-10-09 – 2017-10-14 (×14): 5000 [IU] via SUBCUTANEOUS
  Filled 2017-10-09 (×14): qty 1

## 2017-10-09 MED ORDER — DEXTROSE 5 % IV SOLN
2.0000 g | Freq: Once | INTRAVENOUS | Status: AC
  Administered 2017-10-09: 2 g via INTRAVENOUS
  Filled 2017-10-09: qty 2

## 2017-10-09 MED ORDER — QUETIAPINE FUMARATE 200 MG PO TABS
200.0000 mg | ORAL_TABLET | ORAL | Status: DC
  Administered 2017-10-10 – 2017-10-14 (×5): 200 mg via ORAL
  Filled 2017-10-09: qty 8
  Filled 2017-10-09: qty 1
  Filled 2017-10-09: qty 8
  Filled 2017-10-09: qty 1

## 2017-10-09 MED ORDER — SODIUM CHLORIDE 0.9 % IV SOLN
INTRAVENOUS | Status: DC
  Administered 2017-10-09 – 2017-10-12 (×6): via INTRAVENOUS

## 2017-10-09 MED ORDER — IPRATROPIUM-ALBUTEROL 0.5-2.5 (3) MG/3ML IN SOLN: 3.0000 mL | Freq: Four times a day (QID) | RESPIRATORY_TRACT | Status: DC

## 2017-10-09 MED ORDER — MORPHINE SULFATE (PF) 4 MG/ML IV SOLN
INTRAVENOUS | Status: AC
  Administered 2017-10-09: 4 mg via INTRAVENOUS
  Filled 2017-10-09: qty 1

## 2017-10-09 MED ORDER — LEVOFLOXACIN IN D5W 750 MG/150ML IV SOLN
750.0000 mg | Freq: Once | INTRAVENOUS | Status: AC
  Administered 2017-10-09: 750 mg via INTRAVENOUS
  Filled 2017-10-09: qty 150

## 2017-10-09 MED ORDER — QUETIAPINE FUMARATE 200 MG PO TABS
400.0000 mg | ORAL_TABLET | Freq: Every day | ORAL | Status: DC
  Administered 2017-10-09 – 2017-10-13 (×5): 400 mg via ORAL
  Filled 2017-10-09: qty 2
  Filled 2017-10-09: qty 1
  Filled 2017-10-09 (×2): qty 2
  Filled 2017-10-09: qty 1

## 2017-10-09 NOTE — ED Notes (Signed)
2nd lactic level drawn after 2-hr mark d/t hospital-wide shortage of gray tube supplies. Charge RN aware.

## 2017-10-09 NOTE — H&P (Signed)
History and Physical    Melanie Hahn ZOX:[id number] DOB: [birth year] DOA: 10/09/2017  Referring physician: Dr. Pershing Proud  PCP: Nira Conn, MD  Specialists: none  Chief Complaint: N/V and abdominal pain  HPI: Melanie Hahn is a 61 y.o. female has a past medical history significant for HIV, hepatitis and schizophrenia now with 1-2 week hx of progressive abdominal pain with N/V/D. Presents to ER this AM where CT shows acute pancreatitis. WBC elevated and pt hypertensive and tachycardic. She is now admitted. Also c/o SOB and diffuse CP. No fever. Denies ETOH intake. Does smoke.  Review of Systems: The patient denies anorexia, fever, weight loss,, vision loss, decreased hearing, hoarseness, syncope,, peripheral edema, balance deficits, hemoptysis, melena, hematochezia, severe indigestion/heartburn, hematuria, incontinence, genital sores, muscle weakness, suspicious skin lesions, transient blindness, difficulty walking, depression, unusual weight change, abnormal bleeding, enlarged lymph nodes, angioedema, and breast masses.   Past Medical History:  Diagnosis Date  . Hepatitis C   . HIV (human immunodeficiency virus infection) (HCC)   . Obesity   . Schizophrenia (HCC)    History reviewed. No pertinent surgical history. Social History:  reports that she has been smoking Cigarettes.  She has never used smokeless tobacco. She reports that she does not drink alcohol or use drugs.  Allergies  Allergen Reactions  . Penicillins Other (See Comments)    Has patient had a PCN reaction causing immediate rash, facial/tongue/throat swelling, SOB or lightheadedness with hypotension: unknown Has patient had a PCN reaction causing severe rash involving mucus membranes or skin necrosis: unknown Has patient had a PCN reaction that required hospitalization: unknown Has patient had a PCN reaction occurring within the last 10 years: unknown If all of the above answers are "NO", then may proceed with  Cephalosporin use.     Family History  Problem Relation Age of Onset  . Breast cancer Neg Hx     Prior to Admission medications   Medication Sig Start Date End Date Taking? Authorizing Provider  cholecalciferol (VITAMIN D) 1000 units tablet Take 1,000 Units by mouth daily.   Yes [provider]  divalproex (DEPAKOTE ER) 500 MG 24 hr tablet Take 1,000 mg by mouth 2 (two) times daily.    Yes [provider]  efavirenz-emtricitabine-tenofovir (ATRIPLA) 600-200-300 MG tablet Take 1 tablet by mouth daily.   Yes [provider]  mirabegron ER (MYRBETRIQ) 25 MG TB24 tablet Take 25 mg by mouth daily.   Yes [provider]  polyethylene glycol powder (GLYCOLAX/MIRALAX) powder Take 17 g by mouth 2 (two) times daily. Mix 17 grams in 8 ounces of water and give by mouth up to twice daily as needed for constipation   Yes [provider]  QUEtiapine (SEROQUEL) 200 MG tablet Take 200 mg by mouth every morning.   Yes [provider]  QUEtiapine (SEROQUEL) 400 MG tablet Take 1 tablet (400 mg total) by mouth at bedtime. 07/22/16  Yes Clapacs, Jackquline Denmark, MD  traZODone (DESYREL) 100 MG tablet Take 200 mg by mouth at bedtime.    Yes [provider]  benztropine (COGENTIN) 1 MG tablet Take 1 mg by mouth 2 (two) times daily.    [provider]  hydroxypropyl methylcellulose / hypromellose (ISOPTO TEARS / GONIOVISC) 2.5 % ophthalmic solution 1-2 drops as needed for dry eyes.    [provider]  ibuprofen (ADVIL,MOTRIN) 600 MG tablet Take 600 mg by mouth every 6 (six) hours as needed.    [provider]   Physical Exam: Vitals:  10/09/17 1116 10/09/17 1309 10/09/17 1310 10/09/17 1311  BP:   (!) 130/98   Pulse: (!) 125  (!) 126 (!) 126  Resp: (!) 25 17 19 19   Temp:      TempSrc:      SpO2: 94%  95% 95%  Weight:      Height:         General:  WDWN, [place]/AT, in moderate distress  Eyes: PERRL, EOMI, no scleral icterus,  conjunctiva clear  ENT: moist oropharynx without exudate, TM's benign, dentition poor  Neck: supple, no lymphadenopathy. No bruits or thyromegaly  Cardiovascular: rapid rate with regular rhythm without MRG; 2+ peripheral pulses, no JVD, no peripheral edema  Respiratory: basilar rhonchi without wheezes or rales. No dullness. Respiratory effort normal  Abdomen: distended and diffusely tender to palpation, positive bowel sounds, positive guarding, no rebound  Skin: no rashes or lesions  Musculoskeletal: normal bulk and tone, no joint swelling  Psychiatric: normal mood and affect, A&OX3  Neurologic: CN 2-12 grossly intact, Motor strength 5/5 in all 4 groups with symmetric DTR's and non-focal sensory exam  Labs on Admission:  Basic Metabolic Panel:  Recent Labs Lab 10/09/17 1226  NA 138  K 4.1  CL 105  CO2 19*  GLUCOSE 153*  BUN 24*  CREATININE 1.95*  CALCIUM 9.8   Liver Function Tests:  Recent Labs Lab 10/09/17 1226  AST 42*  ALT 13*  ALKPHOS 66  BILITOT 0.9  PROT 6.4*  ALBUMIN 2.8*   No results for input(s): LIPASE, AMYLASE in the last 168 hours. No results for input(s): AMMONIA in the last 168 hours. CBC:  Recent Labs Lab 10/09/17 1053  WBC 20.2*  NEUTROABS 15.7*  HGB 17.1*  HCT 53.5*  MCV 102.6*  PLT 195   Cardiac Enzymes:  Recent Labs Lab 10/09/17 1226  TROPONINI <0.03    BNP (last 3 results) No results for input(s): BNP in the last 8760 hours.  ProBNP (last 3 results) No results for input(s): PROBNP in the last 8760 hours.  CBG: No results for input(s): GLUCAP in the last 168 hours.  Radiological Exams on Admission: Ct Abdomen Pelvis Wo Contrast  Result Date: 10/09/2017 CLINICAL DATA:  Abdominal pain, nausea and vomiting. Tachycardia. Shaking. Leukocytosis. Lactic acidosis. Smoker. EXAM: CT ABDOMEN AND PELVIS WITHOUT CONTRAST TECHNIQUE: Multidetector CT imaging of the abdomen and pelvis was performed following the standard protocol  without IV contrast. COMPARISON:  Abdomen and pelvis radiographs dated 09/23/2014. Portable chest obtained earlier today. FINDINGS: Lower chest: Prominent interstitial markings and mild patchy and linear density at both lung bases. No pleural fluid. Normal sized heart. Hepatobiliary: Sludge in the gallbladder. Minimal diffuse low density of the liver relative to the spleen. Pancreas: Diffusely enlarged and heterogeneous pancreatic head with poorly defined margins. Extensive bilateral peripancreatic soft tissue stranding and fluid with fluid tracking inferiorly along the paracolic gutters into the pelvis and around the spleen and in right lobe of the liver. Spleen: Normal in size without focal abnormality. Adrenals/Urinary Tract: Adrenal glands are unremarkable. Kidneys are normal, without renal calculi, focal lesion, or hydronephrosis. Bladder is unremarkable. Stomach/Bowel: Diffuse wall thickening involving the second, third and fourth portions of the duodenum with fluid within the lumen. Mild sigmoid colon diverticulosis. Normal appearing appendix. Mild posterior gastric wall thickening. Vascular/Lymphatic: Mild atheromatous arterial calcifications without aneurysm. No enlarged lymph nodes. Reproductive: Uterus and bilateral adnexa are unremarkable. Other: Small to moderate amount of free peritoneal fluid in the abdomen and pelvis, as described above. Musculoskeletal: Mild  lumbar and lower thoracic spine degenerative changes. More pronounced facet degenerative changes in the lower lumbar spine. IMPRESSION: 1. Marked changes of acute pancreatitis involving the head of the pancreas with extensive peripancreatic inflammatory changes, associated gastric and duodenal wall thickening and small to moderate amount of free peritoneal fluid. 2. Minimal diffuse hepatic steatosis. 3. Sludge in the gallbladder. 4. Mild sigmoid diverticulosis. 5. Probable combination of pneumonitis and atelectasis at both lung bases with  underlying chronic interstitial lung disease. Electronically Signed   By: Beckie SaltsSteven  Reid M.D.   On: 10/09/2017 14:36   Ct Head Wo Contrast  Result Date: 10/09/2017 CLINICAL DATA:  61 year old female with a history of altered mental status, seizure EXAM: CT HEAD WITHOUT CONTRAST TECHNIQUE: Contiguous axial images were obtained from the base of the skull through the vertex without intravenous contrast. COMPARISON:  None. FINDINGS: Brain: No acute intracranial hemorrhage. No midline shift or mass effect. Gray-white differentiation maintained. Unremarkable appearance of the ventricular system. Volume loss of cerebellum. This finding is incidental, and has been described with chronic Depakote prescription. Vascular: Unremarkable. Skull: No acute fracture.  No aggressive bone lesion identified. Sinuses/Orbits: Unremarkable appearance of the orbits. Mastoid air cells clear. No middle ear effusion. No significant sinus disease. Other: None IMPRESSION: No CT evidence of acute intracranial abnormality. Preferential volume loss of the cerebellum, which has been described with longstanding Depakote prescription. Electronically Signed   By: Gilmer MorJaime  Wagner D.O.   On: 10/09/2017 14:10   Dg Chest Port 1 View  Result Date: 10/09/2017 CLINICAL DATA:  61 year old female with a history of critical limb ischemia EXAM: PORTABLE CHEST 1 VIEW COMPARISON:  01/01/2016 FINDINGS: Cardiomediastinal silhouette unchanged in size and contour. Low lung volumes. No evidence of central vascular congestion. No interlobular septal thickening. No pneumothorax or pleural effusion. No confluent airspace disease. No displaced fracture IMPRESSION: No radiographic evidence of acute cardiopulmonary disease Electronically Signed   By: Gilmer MorJaime  Wagner D.O.   On: 10/09/2017 11:29   Dg Toe 2nd Right  Result Date: 10/09/2017 CLINICAL DATA:  Pt unable to answer questions. Hx from chart: Tachycardia, smoker, "dry gangrene" right 2nd toe. EXAM: RIGHT SECOND  TOE COMPARISON:  None. FINDINGS: No fracture bone lesion. The joints are normally spaced and aligned. There are no areas of bone resorption to suggest osteomyelitis. There is mild soft tissue swelling of the distal second toe. No soft tissue air. IMPRESSION: 1. No fracture or dislocation. 2. No evidence of osteomyelitis. Electronically Signed   By: Amie Portlandavid  Ormond M.D.   On: 10/09/2017 11:31    EKG: Independently reviewed.  Assessment/Plan Principal Problem:   Acute pancreatitis Active Problems:   Abdominal pain   Intractable nausea and vomiting   HIV antibody positive (HCC)   Will admit to floor with IV fluids, IV ABX, and prn IV pain meds. NPO except meds for now. Repeat lipase in AM. Consult GI and ID.   Diet: NPO except meds Fluids: NS@100  DVT Prophylaxis: SQ heparin  Code Status: FULL  Family Communication: none  Disposition Plan: home  Time spent: 50 min

## 2017-10-09 NOTE — Progress Notes (Addendum)
Pharmacy Antibiotic Note  Smith RobertSheila Schloesser is a 61 y.o. female admitted on 10/09/2017 with sepsis.  Pharmacy has been consulted for vancomycin, levofloxacin, aztreonam dosing.  Plan: Vancomycin 1000mg  IV every 12 hours.  Goal trough 15-20 mcg/mL. aztreonam 1gm iv q8h & levofloxacin 750mg  iv q48h Vancomycin trough level 11/5@0530  before 4th dose of q12h vancomycin 1gm   Height: 5\' 7"  (170.2 cm) Weight: 240 lb (108.9 kg) IBW/kg (Calculated) : 61.6  Temp (24hrs), Avg:97.5 F (36.4 C), Min:97.5 F (36.4 C), Max:97.5 F (36.4 C)   Recent Labs Lab 10/09/17 1053 10/09/17 1226  WBC 20.2*  --   CREATININE  --  1.95*  LATICACIDVEN 7.2*  --     Estimated Creatinine Clearance: 38.5 mL/min (A) (by C-G formula based on SCr of 1.95 mg/dL (H)).    Allergies  Allergen Reactions  . Penicillins Other (See Comments)    Has patient had a PCN reaction causing immediate rash, facial/tongue/throat swelling, SOB or lightheadedness with hypotension: unknown Has patient had a PCN reaction causing severe rash involving mucus membranes or skin necrosis: unknown Has patient had a PCN reaction that required hospitalization: unknown Has patient had a PCN reaction occurring within the last 10 years: unknown If all of the above answers are "NO", then may proceed with Cephalosporin use.     Antimicrobials this admission: Antibiotics Given (last 72 hours)    Date/Time Action Medication Dose Rate   10/09/17 1203 New Bag/Given   aztreonam (AZACTAM) 2 g in dextrose 5 % 50 mL IVPB 2 g 100 mL/hr   10/09/17 1205 New Bag/Given   levofloxacin (LEVAQUIN) IVPB 750 mg 750 mg 100 mL/hr   10/09/17 1211 New Bag/Given   vancomycin (VANCOCIN) IVPB 1000 mg/200 mL premix 1,000 mg 200 mL/hr      Microbiology results: No results found for this or any previous visit (from the past 240 hour(s)).   Thank you for allowing pharmacy to be a part of this patient's care.  Gerre PebblesGarrett Eino Whitner 10/09/2017 1:56 PM

## 2017-10-09 NOTE — Progress Notes (Signed)
Received report from [place]. Pt arrived to unit. Pt alert but forgetful and confused at times. IV infusing NS 19100mL/hr. Pt on 2L O2 acute. Skin assessment completed with Darien RamusAna, RN. Unable to finish Navigator assessment d/t pt confusion. No complaints from patient at this time. Resting in bed. VSS. Will continue to monitor.

## 2017-10-09 NOTE — ED Notes (Signed)
Pt emesis x1, green/brown in color. No coffee grounds noted at this time. Pt c/o generalized abd pain. EDP Schaevitz notified.

## 2017-10-09 NOTE — ED Notes (Signed)
Pt placed on 2L O2 via n/c for O2 sat 90% on RA and tachypnea. O2 sat now 94% on 2L.

## 2017-10-09 NOTE — ED Triage Notes (Signed)
Arrives from group home via EMS.  EMS called for "seizure like activity" that is described as shaking but no LOC.  Patient CBG 269 by EMS and HR:  136.  Patient is AAOx3.  Skin warm and dry.  MAE equally and strong.

## 2017-10-09 NOTE — ED Notes (Signed)
Date and time results received: 10/09/17 1138 (use smartphrase ".now" to insert current time)  Test: lactic acid Critical Value: 7.2  Name of Provider Notified: Dr. Pershing ProudSchaevitz  Orders Received? Or Actions Taken?: abt ordered

## 2017-10-09 NOTE — Progress Notes (Signed)
Pts. Heart rate is sustaining in the 130's. Dr. Anne HahnWillis notified and ordered lopressor.

## 2017-10-09 NOTE — Clinical Social Work Note (Signed)
Clinical Social Work Assessment  Patient Details  Name: Smith RobertSheila Brillhart MRN: [id number] Date of Birth: [birth year]  Date of referral:  10/09/17               Reason for consult:                   Permission sought to share information with:  Guardian, Magazine features editoracility Contact Representative Permission granted to share information::  Yes, Verbal Permission Granted  Name:Guardian Victory DakinVivian Harris [phone]  Agency::   [facility] [phone]  Relationship::     Contact Information:     Housing/Transportation Living arrangements for the past 2 months:  Group Home Source of Information:  Medical Team, Facility Patient Interpreter Needed:  None Criminal Activity/Legal Involvement Pertinent to Current Situation/Hospitalization:  No - Comment as needed Significant Relationships:  Other Family Members, Other(Comment) (Not very involved family members) Lives with:  Facility Resident Do you feel safe going back to the place where you live?  Yes Need for family participation in patient care:  No (Coment)  Care giving concerns: None, Elvia [facility] hopes she gets better and can return to group home   Social Worker assessment / plan: LCSW was unable to access patient or reach patients guardian. LCSW called Group home and was able to collect information to complete brief assessment and start patient Fl2. Patient is able to return to group home [phone]. Guardian was notified by voice mail message Ms Victory DakinVivian Harris [phone]. Patient has medicaid and medicare and has resided at this group home for over 10 years. She is connected to [facility] and also [facility].  She is fairly independent with most of her ADLs and is incontinent but dislikes her pull ups/attends. No special diet, not diabetic. Fl2 will be completed on November 4th due to time constraints.   Employment status:  Retired Health and safety inspectornsurance information:  Armed forces operational officerMedicare, [place] Recommendations:  Not assessed at this  time Information / Referral to community resources:  Other (Comment Required) (Resources)  Patient/Family's Response to care:  Guardian Victory DakinVivian  Harris [facility] and by LCSW and left messages. She is aware patient being admitted  Patient/Family's Understanding of and Emotional Response to Diagnosis, Current Treatment, and Prognosis:  Lacks insight- Guardian aware she is [facility].  Emotional Assessment Appearance:  Appears stated age Attitude/Demeanor/Rapport:  Unable to Assess (Unable to express feeling) Affect (typically observed):  Guarded, Calm, Agitated Orientation:  Oriented to Self, Fluctuating Orientation (Suspected and/or reported Sundowners) Alcohol / Substance use:  Not Applicable Psych involvement (Current and /or in the community):  No (Comment) ([facility])  Discharge Needs  Concerns to be addressed:  No discharge needs identified Readmission within the last 30 days:  No Current discharge risk:  None Barriers to Discharge:  No Barriers Identified   Cheron SchaumannBandi, Teanna Elem M, LCSW 10/09/2017, 5:35 PM

## 2017-10-09 NOTE — ED Provider Notes (Signed)
[facility] Emergency Department Provider Note  ____________________________________________   First MD Initiated Contact with Patient 10/09/17 1044     (approximate)  I have reviewed the triage vital signs and the nursing notes.   HISTORY  Chief Complaint Tachycardia   HPI Melanie Hahn is a 61 y.o. female with a history of hepatitis C as well as HIV and schizophrenia who is presenting to the emergency department today after several episodes of "seizure-like activity."  The patient was observed having seizure-like activity at her group home.  However, EMS was unable to say if the patient lost consciousness.  The patient denies losing consciousness.  However, she does admit to abdominal pain, diffusely as well as an episode of vomiting.  Patient without a history of seizure.  Found to be tachycardic by EMS.  Patient is on doxycycline secondary to a right second toe infection.  However, she does not report any pain to her bilateral feet.   Past Medical History:  Diagnosis Date  . Hepatitis C   . HIV (human immunodeficiency virus infection) (HCC)   . Obesity   . Schizophrenia [facility](HCC)     Patient Active Problem List   Diagnosis Date Noted  . Schizophrenia (HCC) 07/15/2016    History reviewed. No pertinent surgical history.  Prior to Admission medications   Medication Sig Start Date End Date Taking? Authorizing Provider  albuterol (PROVENTIL HFA;VENTOLIN HFA) 108 (90 Base) MCG/ACT inhaler Inhale 2 puffs into the lungs 3 (three) times daily as needed for wheezing or shortness of breath.    [provider]  benztropine (COGENTIN) 1 MG tablet Take 1 mg by mouth 2 (two) times daily.    [provider]  cholecalciferol (VITAMIN D) 1000 units tablet Take 1,000 Units by mouth daily.    [provider]  clonazePAM (KLONOPIN) 0.5 MG tablet Take 0.5 mg by mouth 2 (two) times daily.    [provider]  darifenacin (ENABLEX) 7.5 MG  24 hr tablet Take 7.5 mg by mouth daily.    [provider]  divalproex (DEPAKOTE ER) 500 MG 24 hr tablet Take 500 mg by mouth 2 (two) times daily.    [provider]  efavirenz-emtricitabine-tenofovir (ATRIPLA) 600-200-300 MG tablet Take 1 tablet by mouth daily.    [provider]  hydroxypropyl methylcellulose / hypromellose (ISOPTO TEARS / GONIOVISC) 2.5 % ophthalmic solution 1-2 drops as needed for dry eyes.    [provider]  hydroxypropyl methylcellulose / hypromellose (ISOPTO TEARS / GONIOVISC) 2.5 % ophthalmic solution 1-2 drops as needed for dry eyes.    [provider]  ibuprofen (ADVIL,MOTRIN) 600 MG tablet Take 600 mg by mouth every 6 (six) hours as needed.    [provider]  ketoconazole (NIZORAL) 2 % cream Apply 1 application topically 2 (two) times daily as needed for irritation.    [provider]  QUEtiapine (SEROQUEL) 400 MG tablet Take 1 tablet (400 mg total) by mouth at bedtime. 07/22/16   Clapacs, Jackquline DenmarkJohn T, MD  traZODone (DESYREL) 150 MG tablet Take 150 mg by mouth at bedtime.    [provider]  Vitamin D, Ergocalciferol, (DRISDOL) 50000 units CAPS capsule Take 50,000 Units by mouth every 7 (seven) days.    [provider]    Allergies Penicillins  Family History  Problem Relation Age of Onset  . Breast cancer Neg Hx     Social History Social History  Substance Use Topics  . Smoking status: Current Some Day  Smoker    Types: Cigarettes  . Smokeless tobacco: Never Used  . Alcohol use No    Review of Systems  Constitutional: No fever/chills Eyes: No visual changes. ENT: No sore throat. Cardiovascular: Denies chest pain. Respiratory: Denies shortness of breath. Gastrointestinal: No abdominal pain.  No nausea, no vomiting.  No diarrhea.  No constipation. Genitourinary: Negative for dysuria. Musculoskeletal: Negative for back pain. Skin: Negative for rash. Neurological: Negative for  headaches, focal weakness or numbness.   ____________________________________________   PHYSICAL EXAM:  VITAL SIGNS: ED Triage Vitals  Enc Vitals Group     BP 10/09/17 1040 134/76     Pulse Rate 10/09/17 1040 (!) 135     Resp 10/09/17 1040 20     Temp 10/09/17 1040 (!) 97.5 F (36.4 C)     Temp Source 10/09/17 1040 Oral     SpO2 --      Weight 10/09/17 1041 240 lb (108.9 kg)     Height 10/09/17 1041 5\' 7"  (1.702 m)     Head Circumference --      Peak Flow --      Pain Score --      Pain Loc --      Pain Edu? --      Excl. in GC? --     Constitutional: Alert and oriented. Well appearing and in no acute distress. Eyes: Conjunctivae are normal.  Head: Atraumatic. Nose: No congestion/rhinnorhea. Mouth/Throat: Mucous membranes are moist.  Neck: No stridor.   Cardiovascular: tachycardic, regular rhythm. Grossly normal heart sounds.  Good peripheral circulation with dorsalis pedis pulse present in the right foot. Respiratory: Normal respiratory effort.  No retractions. Lungs CTAB. Gastrointestinal: Soft with mild to moderate diffuse tenderness to palpation. No distention.  Musculoskeletal: No lower extremity tenderness nor edema.  No joint effusions. Neurologic:  Normal speech and language. No gross focal neurologic deficits are appreciated. Skin:    Right second toe with lateral area about 1 cm x 1 cm what appears to be dry gangrene with an area that is firm and black.  Psychiatric: Mood and affect are normal. Speech and behavior are normal.  ____________________________________________   LABS (all labs ordered are listed, but only abnormal results are displayed)  Labs Reviewed  CBC WITH DIFFERENTIAL/PLATELET - Abnormal; Notable for the following:       Result Value   WBC 20.2 (*)    RBC 5.21 (*)    Hemoglobin 17.1 (*)    HCT 53.5 (*)    MCV 102.6 (*)    Neutro Abs 15.7 (*)    Monocytes Absolute 1.8 (*)    All other components within normal limits  LACTIC ACID,  PLASMA - Abnormal; Notable for the following:    Lactic Acid, Venous 7.2 (*)    All other components within normal limits  CULTURE, BLOOD (ROUTINE X 2)  CULTURE, BLOOD (ROUTINE X 2)  URINE CULTURE  COMPREHENSIVE METABOLIC PANEL  URINALYSIS, ROUTINE W REFLEX MICROSCOPIC  LACTIC ACID, PLASMA  TROPONIN I   ____________________________________________  EKG  ED ECG REPORT I, Arelia Longest, the attending physician, personally viewed and interpreted this ECG.   Date: 10/09/2017  EKG Time: 1042  Rate: 135  Rhythm: sinus tachycardia  Axis: Normal  Intervals:none  ST&T Change: No ST segment elevation or depression.  No abnormal T wave inversion.  ____________________________________________  RADIOLOGY  No acute finding on the chest nor foot  CT with severe pancreatitis. ____________________________________________   PROCEDURES  Procedure(s) performed:  Procedures  Critical Care performed:   ____________________________________________   INITIAL IMPRESSION / ASSESSMENT AND PLAN / ED COURSE  Pertinent labs & imaging results that were available during my care of the patient were reviewed by me and considered in my medical decision making (see chart for details).  Differential diagnosis includes, but is not limited to, biliary disease (biliary colic, acute cholecystitis, cholangitis, choledocholithiasis, etc), intrathoracic causes for epigastric abdominal pain including ACS, gastritis, duodenitis, pancreatitis, small bowel or large bowel obstruction, abdominal aortic aneurysm, hernia, and gastritis.  Differential diagnosis includes, but is not limited to, ovarian cyst, ovarian torsion, acute appendicitis, diverticulitis, urinary tract infection/pyelonephritis, endometriosis, bowel obstruction, colitis, renal colic, gastroenteritis, hernia, pregnancy related pain including ectopic pregnancy, etc.  Sepsis, gangrene  As part of my medical decision making, I reviewed  the following data within the electronic MEDICAL RECORD NUMBER Notes from prior ED visits      ----------------------------------------- 2:47 PM on 10/09/2017 -----------------------------------------  Patient at this time no longer vomiting but had multiple episodes of bilious vomiting throughout her stay.  CT scan shows what appears to be severe pancreatitis.  Possible associated sepsis.  Broad-spectrum antibiotics given.  Patient will be admitted to the hospital.  Signed out to Dr. Judithann Sheen.  ____________________________________________   FINAL CLINICAL IMPRESSION(S) / ED DIAGNOSES  pancreatitis.  Sepsis.  Nausea vomiting.    NEW MEDICATIONS STARTED DURING THIS VISIT:  New Prescriptions   No medications on file     Note:  This document was prepared using Dragon voice recognition software and may include unintentional dictation errors.     Myrna Blazer, MD 10/09/17 [phone]

## 2017-10-09 NOTE — Progress Notes (Signed)
CODE SEPSIS - PHARMACY COMMUNICATION  **Broad Spectrum Antibiotics should be administered within 1 hour of Sepsis diagnosis**  Time Code Sepsis Called/Page Received: 1148  Antibiotics Ordered: levofloxacin,aztreonam,vancomycin  Time of 1st antibiotic administration: 1203  Additional action taken by pharmacy: none  If necessary, Name of Provider/Nurse Contacted: none    Roni Scow D Nikolus Marczak, Pharm.D, BCPS Clinical Pharmacist  10/09/2017  12:07 PM

## 2017-10-10 ENCOUNTER — Other Ambulatory Visit: Payer: Self-pay

## 2017-10-10 DIAGNOSIS — K859 Acute pancreatitis without necrosis or infection, unspecified: Principal | ICD-10-CM

## 2017-10-10 LAB — COMPREHENSIVE METABOLIC PANEL
ALT: 14 U/L (ref 14–54)
ANION GAP: 9 (ref 5–15)
AST: 47 U/L — ABNORMAL HIGH (ref 15–41)
Albumin: 2.4 g/dL — ABNORMAL LOW (ref 3.5–5.0)
Alkaline Phosphatase: 65 U/L (ref 38–126)
BUN: 34 mg/dL — ABNORMAL HIGH (ref 6–20)
CALCIUM: 8.8 mg/dL — AB (ref 8.9–10.3)
CHLORIDE: 108 mmol/L (ref 101–111)
CO2: 19 mmol/L — AB (ref 22–32)
CREATININE: 2.26 mg/dL — AB (ref 0.44–1.00)
GFR calc non Af Amer: 22 mL/min — ABNORMAL LOW (ref >60)
GFR, EST AFRICAN AMERICAN: 26 mL/min — AB (ref >60)
Glucose, Bld: 105 mg/dL — ABNORMAL HIGH (ref 65–99)
Potassium: 5 mmol/L (ref 3.5–5.1)
SODIUM: 136 mmol/L (ref 135–145)
TOTAL PROTEIN: 5.8 g/dL — AB (ref 6.5–8.1)
Total Bilirubin: 0.7 mg/dL (ref 0.3–1.2)

## 2017-10-10 LAB — CBC
HCT: 46.3 % (ref 35.0–47.0)
HEMOGLOBIN: 15.1 g/dL (ref 12.0–16.0)
MCH: 32.7 pg (ref 26.0–34.0)
MCHC: 32.7 g/dL (ref 32.0–36.0)
MCV: 100 fL (ref 80.0–100.0)
PLATELETS: 143 10*3/uL — AB (ref 150–440)
RBC: 4.63 MIL/uL (ref 3.80–5.20)
RDW: 14.4 % (ref 11.5–14.5)
WBC: 20.7 10*3/uL — ABNORMAL HIGH (ref 3.6–11.0)

## 2017-10-10 LAB — LIPASE, BLOOD: LIPASE: 153 U/L — AB (ref 11–51)

## 2017-10-10 LAB — GLUCOSE, CAPILLARY: GLUCOSE-CAPILLARY: 121 mg/dL — AB (ref 65–99)

## 2017-10-10 MED ORDER — OXYCODONE-ACETAMINOPHEN 5-325 MG PO TABS
1.0000 | ORAL_TABLET | ORAL | Status: DC | PRN
  Administered 2017-10-10 – 2017-10-11 (×3): 1 via ORAL
  Filled 2017-10-10 (×3): qty 1

## 2017-10-10 MED ORDER — VANCOMYCIN HCL IN DEXTROSE 1-5 GM/200ML-% IV SOLN: 1000.0000 mg | INTRAVENOUS | Status: DC

## 2017-10-10 NOTE — Progress Notes (Signed)
Pt heart rate sustaining in the 120s-130s. MD Sainani notified. No new orders at this time. Will continue to monitor.

## 2017-10-10 NOTE — NC FL2 (Signed)
[place] MEDICAID FL2 LEVEL OF CARE SCREENING TOOL     IDENTIFICATION  Patient Name: Melanie Hahn Birthdate: 1956-04-21 Sex: female Admission Date (Current Location): 10/09/2017  [place] and [place] Number:  Chiropodist and Address:  [facility], [address], [place], [place] [address]      Provider Number: [id number]  Attending Physician Name and Address:  Houston Siren, MD  Relative Name and Phone Number:       Current Level of Care: Hospital Recommended Level of Care: [facility] Prior Approval Number:    Date Approved/Denied:   PASRR Number:    Discharge Plan: Domiciliary (Rest home)    Current Diagnoses: Patient Active Problem List   Diagnosis Date Noted  . Abdominal pain 10/09/2017  . Acute pancreatitis 10/09/2017  . Intractable nausea and vomiting 10/09/2017  . HIV antibody positive (HCC) 10/09/2017  . Schizophrenia (HCC) 07/15/2016    Orientation RESPIRATION BLADDER Height & Weight        Normal Incontinent Weight: 206 lb 4.8 oz (93.6 kg) Height:  5\' 7"  (170.2 cm)  BEHAVIORAL SYMPTOMS/MOOD NEUROLOGICAL BOWEL NUTRITION STATUS      Incontinent Diet  AMBULATORY STATUS COMMUNICATION OF NEEDS Skin   Independent   Normal                       Personal Care Assistance Level of Assistance  Bathing, Feeding, Dressing, Total care Bathing Assistance: Limited assistance Feeding assistance: Limited assistance Dressing Assistance: Limited assistance Total Care Assistance: Limited assistance   Functional Limitations Info  Sight, Hearing, Speech Sight Info: Adequate Hearing Info: Adequate Speech Info: Adequate    SPECIAL CARE FACTORS FREQUENCY                       Contractures Contractures Info: Not present    Additional Factors Info  Allergies, Code Status Code Status Info: Full Allergies Info: Penicillins           Current Medications (10/10/2017):  This is the current hospital active  medication list Current Facility-Administered Medications  Medication Dose Route Frequency Provider Last Rate Last Dose  . 0.9 %  sodium chloride infusion   Intravenous Continuous Marguarite Arbour, MD 100 mL/hr at 10/10/17 0930    . acetaminophen (TYLENOL) tablet 650 mg  650 mg Oral Q6H PRN Marguarite Arbour, MD       Or  . acetaminophen (TYLENOL) suppository 650 mg  650 mg Rectal Q6H PRN Marguarite Arbour, MD      . aztreonam (AZACTAM) 1 g in dextrose 5 % 50 mL IVPB  1 g Intravenous Q8H Coffee, Garrett, RPH 100 mL/hr at 10/10/17 0447 1 g at 10/10/17 0447  . benztropine (COGENTIN) tablet 1 mg  1 mg Oral BID Marguarite Arbour, MD   1 mg at 10/10/17 8119  . bisacodyl (DULCOLAX) suppository 10 mg  10 mg Rectal Daily PRN Marguarite Arbour, MD      . divalproex (DEPAKOTE ER) 24 hr tablet 1,000 mg  1,000 mg Oral BID Marguarite Arbour, MD   1,000 mg at 10/10/17 0919  . efavirenz-emtricitabine-tenofovir (ATRIPLA) 600-200-300 MG per tablet 1 tablet  1 tablet Oral Daily Marguarite Arbour, MD   1 tablet at 10/10/17 0919  . heparin injection 5,000 Units  5,000 Units Subcutaneous Q8H Marguarite Arbour, MD   5,000 Units at 10/10/17 0447  . hydrALAZINE (APRESOLINE) injection 10 mg  10 mg Intravenous  Q4H PRN Marguarite ArbourSparks, Jeffrey D, MD      . HYDROmorphone (DILAUDID) injection 1 mg  1 mg Intravenous Q2H PRN Marguarite ArbourSparks, Jeffrey D, MD   1 mg at 10/10/17 0646  . iopamidol (ISOVUE-300) 61 % injection 30 mL  30 mL Oral Once PRN Myrna BlazerSchaevitz, David Matthew, MD      . ipratropium-albuterol (DUONEB) 0.5-2.5 (3) MG/3ML nebulizer solution 3 mL  3 mL Nebulization Q6H PRN Marguarite ArbourSparks, Jeffrey D, MD      . Melene Muller[START ON 10/11/2017] levofloxacin (LEVAQUIN) IVPB 750 mg  750 mg Intravenous Q48H Coffee, Gerre PebblesGarrett, [facility] CenterRPH      . LORazepam (ATIVAN) injection 0.5 mg  0.5 mg Intravenous Q4H PRN Marguarite ArbourSparks, Jeffrey D, MD   0.5 mg at 10/10/17 0220  . ondansetron (ZOFRAN) tablet 4 mg  4 mg Oral Q6H PRN Marguarite ArbourSparks, Jeffrey D, MD       Or  . ondansetron (ZOFRAN)  injection 4 mg  4 mg Intravenous Q6H PRN Marguarite ArbourSparks, Jeffrey D, MD      . pantoprazole (PROTONIX) injection 40 mg  40 mg Intravenous Q12H Marguarite ArbourSparks, Jeffrey D, MD   40 mg at 10/10/17 0920  . QUEtiapine (SEROQUEL) tablet 200 mg  200 mg Oral Jacquenette ShoneBH-q7a Sparks, Jeffrey D, MD   200 mg at 10/10/17 0748  . QUEtiapine (SEROQUEL) tablet 400 mg  400 mg Oral QHS Marguarite ArbourSparks, Jeffrey D, MD   400 mg at 10/09/17 2022  . sodium chloride 0.9 % bolus 500 mL  500 mL Intravenous Once Myrna BlazerSchaevitz, David Matthew, MD      . traZODone (DESYREL) tablet 200 mg  200 mg Oral QHS Marguarite ArbourSparks, Jeffrey D, MD   200 mg at 10/09/17 2021  . vancomycin (VANCOCIN) IVPB 1000 mg/200 mL premix  1,000 mg Intravenous Q12H Coffee, Gerre PebblesGarrett, RPH 200 mL/hr at 10/10/17 0549 1,000 mg at 10/10/17 0549     Discharge Medications: Please see discharge summary for a list of discharge medications.  Relevant Imaging Results:  Relevant Lab Results:   Additional Information  SSN [id number]  Cheron SchaumannBandi, Benoit Meech M, [place]

## 2017-10-10 NOTE — Consult Note (Signed)
Patient ID: Melanie Hahn, female   DOB: 08-31-1956, 61 y.o.   MRN: [id number]  CC: Pancreatitis  HPI Melanie RobertSheila Andon is a 61 y.o. female who is currently admitted to the internal medicine service for acute pancreatitis.  General surgery consult requested by Dr.Sainani due to the pancreatitis with a question of whether or not it could be from a biliary source.  Patient is a very poor historian and had to be awoken from a sound sleep in order to obtain a history.  Per report she has had multiple day history of abdominal pain.  Patient reports that the pain is better but has not gone away.  She has been having nausea but denies vomiting.  Patient denies any fevers, chills, diarrhea, constipation, dysuria.  She has had abdominal pain, nausea, shortness of breath, chest pain.  She has a documented history of polysubstance abuse within her chart but denies any recently.  She is known to have HIV, hepatitis, schizophrenia.  HPI  Past Medical History:  Diagnosis Date  . Hepatitis C   . HIV (human immunodeficiency virus infection) (HCC)   . Obesity   . Schizophrenia (HCC)     History reviewed. No pertinent surgical history.  Patient denies any prior abdominal surgeries  Family History  Problem Relation Age of Onset  . Breast cancer Neg Hx     Social History Social History   Tobacco Use  . Smoking status: Current Some Day Smoker    Types: Cigarettes  . Smokeless tobacco: Never Used  Substance Use Topics  . Alcohol use: No  . Drug use: No    Allergies  Allergen Reactions  . Penicillins Other (See Comments)    Has patient had a PCN reaction causing immediate rash, facial/tongue/throat swelling, SOB or lightheadedness with hypotension: unknown Has patient had a PCN reaction causing severe rash involving mucus membranes or skin necrosis: unknown Has patient had a PCN reaction that required hospitalization: unknown Has patient had a PCN reaction occurring within the last 10 years: unknown If  all of the above answers are "NO", then may proceed with Cephalosporin use.     Current Facility-Administered Medications  Medication Dose Route Frequency Provider Last Rate Last Dose  . 0.9 %  sodium chloride infusion   Intravenous Continuous Marguarite ArbourSparks, Jeffrey D, MD 100 mL/hr at 10/10/17 0930    . acetaminophen (TYLENOL) tablet 650 mg  650 mg Oral Q6H PRN Marguarite ArbourSparks, Jeffrey D, MD       Or  . acetaminophen (TYLENOL) suppository 650 mg  650 mg Rectal Q6H PRN Marguarite ArbourSparks, Jeffrey D, MD      . benztropine (COGENTIN) tablet 1 mg  1 mg Oral BID Marguarite ArbourSparks, Jeffrey D, MD   1 mg at 10/10/17 [id number]  . bisacodyl (DULCOLAX) suppository 10 mg  10 mg Rectal Daily PRN Marguarite ArbourSparks, Jeffrey D, MD      . divalproex (DEPAKOTE ER) 24 hr tablet 1,000 mg  1,000 mg Oral BID Marguarite ArbourSparks, Jeffrey D, MD   1,000 mg at 10/10/17 0919  . heparin injection 5,000 Units  5,000 Units Subcutaneous Q8H Marguarite ArbourSparks, Jeffrey D, MD   5,000 Units at 10/10/17 1401  . hydrALAZINE (APRESOLINE) injection 10 mg  10 mg Intravenous Q4H PRN Marguarite ArbourSparks, Jeffrey D, MD      . HYDROmorphone (DILAUDID) injection 1 mg  1 mg Intravenous Q2H PRN Marguarite ArbourSparks, Jeffrey D, MD   1 mg at 10/10/17 0646  . iopamidol (ISOVUE-300) 61 % injection 30 mL  30 mL Oral Once PRN Gladstone PihSchaevitz, David  Molli Hazard, MD      . ipratropium-albuterol (DUONEB) 0.5-2.5 (3) MG/3ML nebulizer solution 3 mL  3 mL Nebulization Q6H PRN Marguarite Arbour, MD      . Melene Muller ON 10/11/2017] levofloxacin (LEVAQUIN) IVPB 750 mg  750 mg Intravenous Q48H Coffee, Gerre Pebbles, [facility]      . LORazepam (ATIVAN) injection 0.5 mg  0.5 mg Intravenous Q4H PRN Marguarite Arbour, MD   0.5 mg at 10/10/17 0220  . ondansetron (ZOFRAN) tablet 4 mg  4 mg Oral Q6H PRN Marguarite Arbour, MD       Or  . ondansetron (ZOFRAN) injection 4 mg  4 mg Intravenous Q6H PRN Marguarite Arbour, MD      . oxyCODONE-acetaminophen (PERCOCET/ROXICET) 5-325 MG per tablet 1 tablet  1 tablet Oral Q4H PRN Houston Siren, MD   1 tablet at 10/10/17 1408  . pantoprazole  (PROTONIX) injection 40 mg  40 mg Intravenous Q12H Marguarite Arbour, MD   40 mg at 10/10/17 0920  . QUEtiapine (SEROQUEL) tablet 200 mg  200 mg Oral Jacquenette Shone, MD   200 mg at 10/10/17 0748  . QUEtiapine (SEROQUEL) tablet 400 mg  400 mg Oral QHS Marguarite Arbour, MD   400 mg at 10/09/17 2022  . sodium chloride 0.9 % bolus 500 mL  500 mL Intravenous Once Myrna Blazer, MD      . traZODone (DESYREL) tablet 200 mg  200 mg Oral QHS Marguarite Arbour, MD   200 mg at 10/09/17 2021     Review of Systems A multi-point review of systems was asked and was negative except for the findings documented in the HPI  Physical Exam Blood pressure 108/76, pulse (!) 120, temperature 98.8 F (37.1 C), temperature source Oral, resp. rate 16, height 5\' 7"  (1.702 m), weight 93.6 kg (206 lb 4.8 oz), SpO2 94 %. CONSTITUTIONAL: Resting in bed no acute distress. EYES: Pupils are equal, round, and reactive to light, Sclera are non-icteric. EARS, NOSE, MOUTH AND THROAT: The oropharynx is clear. The oral mucosa is pink and moist. Hearing is intact to voice. LYMPH NODES:  Lymph nodes in the neck are normal. RESPIRATORY:  Lungs are clear. There is normal respiratory effort, with equal breath sounds bilaterally, and without pathologic use of accessory muscles. CARDIOVASCULAR: Heart is tachycardic GI: The abdomen is soft, tender to palpation in all quadrants with some guarding but no rebound, mildly distended. There are no palpable masses. There is no hepatosplenomegaly. There are normal bowel sounds in all quadrants. GU: Rectal deferred.   MUSCULOSKELETAL: Normal muscle strength and tone. No cyanosis or edema.   SKIN: Turgor is good and there are no pathologic skin lesions or ulcers. NEUROLOGIC: Motor and sensation is grossly normal. Cranial nerves are grossly intact. PSYCH:  Oriented to person, place and time.  Data Reviewed Images and labs reviewed.  Labs concerning for leukocytosis of 20.7,  numerous additional lab abnormalities including a persistently elevated lipase of 153, a rising creatinine of 2.26, BUN 34, CO2 of 19, AST of 47.  CT scan of the abdomen from yesterday shows extensive inflammation of the pancreas and entirety of duodenum.  There is some free fluid around the liver.  The gallbladder is poorly visualized on the CT scan.  I have personally reviewed the patient's imaging, laboratory findings and medical records.    Assessment    Pancreatitis    Plan    61 year old female with numerous medical problems including HIV and hepatitis  with pancreatitis.  Patient is a poor historian.  Surgery consulted for the question of biliary pancreatitis.  Unclear based on the images that are currently provided whether or not this is from a biliary source.  The comments of sludge within the gallbladder is not ideally visualized from a CT scan and therefore no surgery would be offered based on that CT alone.  Regardless, patient appears to continue to need resuscitation and treatment for her severe pancreatitis that was visualized on her CT scan.  Given her multiple medical problems and the severity of the visualized pancreatitis it would be sometime before the inflammation was resolved enough in order to even workup her gallbladder to determine whether or not it was a source of her pancreatitis.  Patient will need to be followed closely to ensure that she does not suffer from necrosis of the pancreas secondary to her pancreatitis.  If she does not have improvement in her white blood cell count or her abdominal pain within the next 24 hours should consider repeat cross-sectional imaging.     Time spent with the patient was 80 minutes, with more than 50% of the time spent in face-to-face education, counseling and care coordination.     Ricarda Frame, MD FACS General Surgeon 10/10/2017, 5:43 PM

## 2017-10-10 NOTE — Progress Notes (Signed)
Pharmacy Antibiotic Note  Melanie Hahn is a 61 y.o. female admitted on 10/09/2017 with sepsis.  Pharmacy has been consulted for vancomycin, levofloxacin, aztreonam dosing.  Plan: Renal function declining. Will change vanc dose to 1g q 24 hours. Trough prior to dose on 11/8  Height: 5\' 7"  (170.2 cm) Weight: 206 lb 4.8 oz (93.6 kg) IBW/kg (Calculated) : 61.6  Temp (24hrs), Avg:97.9 F (36.6 C), Min:97.5 F (36.4 C), Max:98.5 F (36.9 C)  Recent Labs  Lab 10/09/17 1053 10/09/17 1226 10/09/17 1435 10/10/17 0419  WBC 20.2*  --   --  20.7*  CREATININE  --  1.95*  --  2.26*  LATICACIDVEN 7.2*  --  3.0*  --     Estimated Creatinine Clearance: 30.7 mL/min (A) (by C-G formula based on SCr of 2.26 mg/dL (H)).    Allergies  Allergen Reactions  . Penicillins Other (See Comments)    Has patient had a PCN reaction causing immediate rash, facial/tongue/throat swelling, SOB or lightheadedness with hypotension: unknown Has patient had a PCN reaction causing severe rash involving mucus membranes or skin necrosis: unknown Has patient had a PCN reaction that required hospitalization: unknown Has patient had a PCN reaction occurring within the last 10 years: unknown If all of the above answers are "NO", then may proceed with Cephalosporin use.     Antimicrobials this admission: Antibiotics Given (last 72 hours)    Date/Time Action Medication Dose Rate   10/09/17 1203 New Bag/Given   aztreonam (AZACTAM) 2 g in dextrose 5 % 50 mL IVPB 2 g 100 mL/hr   10/09/17 1205 New Bag/Given   levofloxacin (LEVAQUIN) IVPB 750 mg 750 mg 100 mL/hr   10/09/17 1211 New Bag/Given   vancomycin (VANCOCIN) IVPB 1000 mg/200 mL premix 1,000 mg 200 mL/hr   10/09/17 1802 New Bag/Given   vancomycin (VANCOCIN) IVPB 1000 mg/200 mL premix 1,000 mg 200 mL/hr   10/09/17 2021 Given   efavirenz-emtricitabine-tenofovir (ATRIPLA) 600-200-300 MG per tablet 1 tablet 1 tablet    10/09/17 2104 New Bag/Given   aztreonam  (AZACTAM) 1 g in dextrose 5 % 50 mL IVPB 1 g 100 mL/hr   10/10/17 0447 New Bag/Given   aztreonam (AZACTAM) 1 g in dextrose 5 % 50 mL IVPB 1 g 100 mL/hr   10/10/17 0549 New Bag/Given   vancomycin (VANCOCIN) IVPB 1000 mg/200 mL premix 1,000 mg 200 mL/hr   10/10/17 0919 Given   efavirenz-emtricitabine-tenofovir (ATRIPLA) 600-200-300 MG per tablet 1 tablet 1 tablet       Microbiology results: Recent Results (from the past 240 hour(s))  Blood Culture (routine x 2)     Status: None (Preliminary result)   Collection Time: 10/09/17 10:53 AM  Result Value Ref Range Status   Specimen Description BLOOD RIGHT ASSIST CONTROL  Final   Special Requests   Final    BOTTLES DRAWN AEROBIC AND ANAEROBIC Blood Culture results may not be optimal due to an inadequate volume of blood received in culture bottles   Culture NO GROWTH < 24 HOURS  Final   Report Status PENDING  Incomplete  Blood Culture (routine x 2)     Status: None (Preliminary result)   Collection Time: 10/09/17 11:24 AM  Result Value Ref Range Status   Specimen Description BLOOD LEFT ASSIST CONTROL  Final   Special Requests   Final    BOTTLES DRAWN AEROBIC AND ANAEROBIC Blood Culture adequate volume   Culture NO GROWTH < 24 HOURS  Final   Report Status PENDING  Incomplete  Thank you for allowing pharmacy to be a part of this patient's care.  Olene Floss, Pharm.D, BCPS Clinical Pharmacist  10/10/2017 10:16 AM

## 2017-10-10 NOTE — Progress Notes (Signed)
[facility] [place] [facility]   PATIENT NAME: Melanie Hahn    MR#:  [id number]  DATE OF BIRTH:  1956-08-01  SUBJECTIVE:   Patient here due to abdominal pain and nausea and noted to have CT scan findings suggestive of acute pancreatitis. Patient still complaining of abdominal pain and intermittent nausea.  Lipase has trended down.   REVIEW OF SYSTEMS:    Review of Systems  Constitutional: Negative for chills and fever.  HENT: Negative for congestion and tinnitus.   Eyes: Negative for blurred vision and double vision.  Respiratory: Negative for cough, shortness of breath and wheezing.   Cardiovascular: Negative for chest pain, orthopnea and PND.  Gastrointestinal: Positive for abdominal pain and nausea. Negative for diarrhea and vomiting.  Genitourinary: Negative for dysuria and hematuria.  Neurological: Negative for dizziness, sensory change and focal weakness.  All other systems reviewed and are negative.   Nutrition: NPO Tolerating Diet: No Tolerating PT: Await Eval.   DRUG ALLERGIES:   Allergies  Allergen Reactions  . Penicillins Other (See Comments)    Has patient had a PCN reaction causing immediate rash, facial/tongue/throat swelling, SOB or lightheadedness with hypotension: unknown Has patient had a PCN reaction causing severe rash involving mucus membranes or skin necrosis: unknown Has patient had a PCN reaction that required hospitalization: unknown Has patient had a PCN reaction occurring within the last 10 years: unknown If all of the above answers are "NO", then may proceed with Cephalosporin use.     VITALS:  Blood pressure 120/75, pulse (!) 122, temperature 98.5 F (36.9 C), temperature source Oral, resp. rate 16, height 5\' 7"  (1.702 m), weight 93.6 kg (206 lb 4.8 oz), SpO2 100 %.  PHYSICAL EXAMINATION:   Physical Exam  GENERAL:  61 y.o.-year-old patient lying in bed in no acute distress.  EYES: Pupils equal, round, reactive to  light and accommodation. No scleral icterus. Extraocular muscles intact.  HEENT: Head atraumatic, normocephalic. Oropharynx and nasopharynx clear.  NECK:  Supple, no jugular venous distention. No thyroid enlargement, no tenderness.  LUNGS: Normal breath sounds bilaterally, no wheezing, rales, rhonchi. No use of accessory muscles of respiration.  CARDIOVASCULAR: S1, S2 normal. No murmurs, rubs, or gallops.  ABDOMEN: Soft, diffusely tender, no rebound, rigidity,  distended. Bowel sounds Hypoactive. No organomegaly or mass.  EXTREMITIES: No cyanosis, clubbing or edema b/l.    NEUROLOGIC: Cranial nerves II through XII are intact. No focal Motor or sensory deficits b/l.  Globally weak.  PSYCHIATRIC: The patient is alert and oriented x 3.  SKIN: No obvious rash, lesion, or ulcer.    LABORATORY PANEL:   CBC Recent Labs  Lab 10/10/17 0419  WBC 20.7*  HGB 15.1  HCT 46.3  PLT 143*   ------------------------------------------------------------------------------------------------------------------  Chemistries  Recent Labs  Lab 10/10/17 0419  NA 136  K 5.0  CL 108  CO2 19*  GLUCOSE 105*  BUN 34*  CREATININE 2.26*  CALCIUM 8.8*  AST 47*  ALT 14  ALKPHOS 65  BILITOT 0.7   ------------------------------------------------------------------------------------------------------------------  Cardiac Enzymes Recent Labs  Lab 10/09/17 1226  TROPONINI <0.03   ------------------------------------------------------------------------------------------------------------------  RADIOLOGY:  Ct Abdomen Pelvis Wo Contrast  Result Date: 10/09/2017 CLINICAL DATA:  Abdominal pain, nausea and vomiting. Tachycardia. Shaking. Leukocytosis. Lactic acidosis. Smoker. EXAM: CT ABDOMEN AND PELVIS WITHOUT CONTRAST TECHNIQUE: Multidetector CT imaging of the abdomen and pelvis was performed following the standard protocol without IV contrast. COMPARISON:  Abdomen and pelvis radiographs dated 09/23/2014.  Portable chest obtained earlier  today. FINDINGS: Lower chest: Prominent interstitial markings and mild patchy and linear density at both lung bases. No pleural fluid. Normal sized heart. Hepatobiliary: Sludge in the gallbladder. Minimal diffuse low density of the liver relative to the spleen. Pancreas: Diffusely enlarged and heterogeneous pancreatic head with poorly defined margins. Extensive bilateral peripancreatic soft tissue stranding and fluid with fluid tracking inferiorly along the paracolic gutters into the pelvis and around the spleen and in right lobe of the liver. Spleen: Normal in size without focal abnormality. Adrenals/Urinary Tract: Adrenal glands are unremarkable. Kidneys are normal, without renal calculi, focal lesion, or hydronephrosis. Bladder is unremarkable. Stomach/Bowel: Diffuse wall thickening involving the second, third and fourth portions of the duodenum with fluid within the lumen. Mild sigmoid colon diverticulosis. Normal appearing appendix. Mild posterior gastric wall thickening. Vascular/Lymphatic: Mild atheromatous arterial calcifications without aneurysm. No enlarged lymph nodes. Reproductive: Uterus and bilateral adnexa are unremarkable. Other: Small to moderate amount of free peritoneal fluid in the abdomen and pelvis, as described above. Musculoskeletal: Mild lumbar and lower thoracic spine degenerative changes. More pronounced facet degenerative changes in the lower lumbar spine. IMPRESSION: 1. Marked changes of acute pancreatitis involving the head of the pancreas with extensive peripancreatic inflammatory changes, associated gastric and duodenal wall thickening and small to moderate amount of free peritoneal fluid. 2. Minimal diffuse hepatic steatosis. 3. Sludge in the gallbladder. 4. Mild sigmoid diverticulosis. 5. Probable combination of pneumonitis and atelectasis at both lung bases with underlying chronic interstitial lung disease. Electronically Signed   By: Beckie Salts  M.D.   On: 10/09/2017 14:36   Ct Head Wo Contrast  Result Date: 10/09/2017 CLINICAL DATA:  61 year old female with a history of altered mental status, seizure EXAM: CT HEAD WITHOUT CONTRAST TECHNIQUE: Contiguous axial images were obtained from the base of the skull through the vertex without intravenous contrast. COMPARISON:  None. FINDINGS: Brain: No acute intracranial hemorrhage. No midline shift or mass effect. Gray-white differentiation maintained. Unremarkable appearance of the ventricular system. Volume loss of cerebellum. This finding is incidental, and has been described with chronic Depakote prescription. Vascular: Unremarkable. Skull: No acute fracture.  No aggressive bone lesion identified. Sinuses/Orbits: Unremarkable appearance of the orbits. Mastoid air cells clear. No middle ear effusion. No significant sinus disease. Other: None IMPRESSION: No CT evidence of acute intracranial abnormality. Preferential volume loss of the cerebellum, which has been described with longstanding Depakote prescription. Electronically Signed   By: Gilmer Mor D.O.   On: 10/09/2017 14:10   Dg Chest Port 1 View  Result Date: 10/09/2017 CLINICAL DATA:  61 year old female with a history of critical limb ischemia EXAM: PORTABLE CHEST 1 VIEW COMPARISON:  01/01/2016 FINDINGS: Cardiomediastinal silhouette unchanged in size and contour. Low lung volumes. No evidence of central vascular congestion. No interlobular septal thickening. No pneumothorax or pleural effusion. No confluent airspace disease. No displaced fracture IMPRESSION: No radiographic evidence of acute cardiopulmonary disease Electronically Signed   By: Gilmer Mor D.O.   On: 10/09/2017 11:29   Dg Toe 2nd Right  Result Date: 10/09/2017 CLINICAL DATA:  Pt unable to answer questions. Hx from chart: Tachycardia, smoker, "dry gangrene" right 2nd toe. EXAM: RIGHT SECOND TOE COMPARISON:  None. FINDINGS: No fracture bone lesion. The joints are normally spaced  and aligned. There are no areas of bone resorption to suggest osteomyelitis. There is mild soft tissue swelling of the distal second toe. No soft tissue air. IMPRESSION: 1. No fracture or dislocation. 2. No evidence of osteomyelitis. Electronically Signed   By: Onalee Hua  Ormond M.D.   On: 10/09/2017 11:31     ASSESSMENT AND PLAN:   61 year old female with past medical history of HIV, schizophrenia, hepatitis C who presents to the hospital due to abdominal pain, tachycardia and was noted to be in acute pancreatitis.  1. Acute pancreatitis-etiology unclear presently. Patient has no history of alcohol abuse, LFTs are stable - ?? Related to the HIV meds the patient is taking. I will discontinue her Atripla for now. -Continue supportive care with IV fluids, antiemetics, pain control. Await gastroenterology input.  2. Leukocytosis-stress mediated from the acute pancreatitis. Unclear there is a true infectious source. -We'll discontinue broad-spectrum antibiotics with aztreonam, vancomycin. Continue Levaquin for now. Follow white cell count.  3. Acute kidney injury-secondary to the acute pancreatitis. Continue IV fluids, follow BUN and creatinine urine output. Renal dose meds, avoid nephrotoxins.  4. History of HIV-we'll DC Atripla for now as this could possibly be contributing to patient's pancreatitis. Await further infectious disease input tomorrow.  5. History of schizophrenia-continue Depakote, Seroquel, Cogentin.   All the records are reviewed and case discussed with Care Management/Social Worker. Management plans discussed with the patient, family and they are in agreement.  CODE STATUS: Full code  DVT Prophylaxis: Heparin subcutaneous  TOTAL TIME TAKING CARE OF THIS PATIENT: 30 minutes.   POSSIBLE D/C IN 2-3 DAYS, DEPENDING ON CLINICAL CONDITION.   [name],Bomani Oommen J M.D on 10/10/2017 at 12:05 PM  Between 7am to 6pm - Pager - [phone]  After 6pm go to [email or web address] - Air traffic controllerpassword [email or web address]  [facility]  [facility] [place] [facility]  Office  [phone]  CC: Primary care physician; [name], [name], MD

## 2017-10-10 NOTE — Consult Note (Signed)
GI Inpatient Consult Note Jamey Reaseodoro Keith Toledo, M.D.  Reason for Consult: Acute pancreatitis   Attending Requesting Consult: Joaquim LaiV. Sainani, M.D.   History of Present Illness: Melanie RobertSheila Hahn is a 61 y.o. female admitted for acute pancreatitis. Patient has HIV on Atripla for several years. She denies alcohol or illicit drug intake. Patient reports pain began acutely and worsened over a 3 day period. No hemetemesis. She says she overall feels better today, but WBC has increased over 21,000. Patient underwent CT scan showing significant pancreatitis with edema of the entire gland and tracking of fluid down the paracolic gutters. There was also some gallbladder sludge evident. No abscess or phlegmon was noted.   Past Medical History:  Past Medical History:  Diagnosis Date  . Hepatitis C   . HIV (human immunodeficiency virus infection) (HCC)   . Obesity   . Schizophrenia [facility](HCC)     Problem List: Patient Active Problem List   Diagnosis Date Noted  . Abdominal pain 10/09/2017  . Acute pancreatitis 10/09/2017  . Intractable nausea and vomiting 10/09/2017  . HIV antibody positive (HCC) 10/09/2017  . Schizophrenia (HCC) 07/15/2016    Past Surgical History: History reviewed. No pertinent surgical history.  Allergies: Allergies  Allergen Reactions  . Penicillins Other (See Comments)    Has patient had a PCN reaction causing immediate rash, facial/tongue/throat swelling, SOB or lightheadedness with hypotension: unknown Has patient had a PCN reaction causing severe rash involving mucus membranes or skin necrosis: unknown Has patient had a PCN reaction that required hospitalization: unknown Has patient had a PCN reaction occurring within the last 10 years: unknown If all of the above answers are "NO", then may proceed with Cephalosporin use.     Home Medications: Medications Prior to Admission  Medication Sig Dispense Refill Last Dose  . cholecalciferol (VITAMIN D) 1000 units tablet Take 1,000  Units by mouth daily.   10/08/2017 at 0800  . divalproex (DEPAKOTE ER) 500 MG 24 hr tablet Take 1,000 mg by mouth 2 (two) times daily.    10/08/2017 at 0800  . efavirenz-emtricitabine-tenofovir (ATRIPLA) 600-200-300 MG tablet Take 1 tablet by mouth daily.   10/07/2017 at 2000  . mirabegron ER (MYRBETRIQ) 25 MG TB24 tablet Take 25 mg by mouth daily.   10/08/2017 at 0800  . polyethylene glycol powder (GLYCOLAX/MIRALAX) powder Take 17 g by mouth 2 (two) times daily. Mix 17 grams in 8 ounces of water and give by mouth up to twice daily as needed for constipation   prn at prn  . QUEtiapine (SEROQUEL) 200 MG tablet Take 200 mg by mouth every morning.   10/08/2017 at 0800  . QUEtiapine (SEROQUEL) 400 MG tablet Take 1 tablet (400 mg total) by mouth at bedtime. 30 tablet 0 10/07/2017 at 2000  . traZODone (DESYREL) 100 MG tablet Take 200 mg by mouth at bedtime.    10/07/2017 at 2000  . benztropine (COGENTIN) 1 MG tablet Take 1 mg by mouth 2 (two) times daily.   prn at prn  . hydroxypropyl methylcellulose / hypromellose (ISOPTO TEARS / GONIOVISC) 2.5 % ophthalmic solution 1-2 drops as needed for dry eyes.   prn at prn  . ibuprofen (ADVIL,MOTRIN) 600 MG tablet Take 600 mg by mouth every 6 (six) hours as needed.   prn at prn   Home medication reconciliation was completed with the patient.   Scheduled Inpatient Medications:   . benztropine  1 mg Oral BID  . divalproex  1,000 mg Oral BID  . heparin  5,000  Units Subcutaneous Q8H  . pantoprazole (PROTONIX) IV  40 mg Intravenous Q12H  . QUEtiapine  200 mg Oral BH-q7a  . QUEtiapine  400 mg Oral QHS  . traZODone  200 mg Oral QHS    Continuous Inpatient Infusions:   . sodium chloride 100 mL/hr at 10/10/17 0930  . [START ON 10/11/2017] levofloxacin (LEVAQUIN) IV    . sodium chloride      PRN Inpatient Medications:  acetaminophen **OR** acetaminophen, bisacodyl, hydrALAZINE, HYDROmorphone (DILAUDID) injection, iopamidol, ipratropium-albuterol, LORazepam,  ondansetron **OR** ondansetron (ZOFRAN) IV, oxyCODONE-acetaminophen  Family History: family history is not on file.   GI Family History: negative  Social History:   reports that she has been smoking cigarettes.  she has never used smokeless tobacco. She reports that she does not drink alcohol or use drugs. The patient denies ETOH, tobacco, or drug use.    Review of Systems: Review of Systems - General ROS: positive for  - night sweats Ophthalmic ROS: negative Hematological and Lymphatic ROS: negative for - bleeding problems, blood clots or blood transfusions Endocrine ROS: negative Respiratory ROS: no cough, shortness of breath, or wheezing Cardiovascular ROS: no chest pain or dyspnea on exertion Genito-Urinary ROS: negative Musculoskeletal ROS: negative Neurological ROS: positive for - schizophrenia  Physical Examination: BP 108/76 (BP Location: Left Arm)   Pulse (!) 120   Temp 98.8 F (37.1 C) (Oral)   Resp 16   Ht 5\' 7"  (1.702 m)   Wt 93.6 kg (206 lb 4.8 oz)   SpO2 94%   BMI 32.31 kg/m    Physical Exam  Constitutional: She is well-developed, well-nourished, and in no distress.  HENT:  Head: Normocephalic and atraumatic.  Eyes: Pupils are equal, round, and reactive to light.  Neck: Normal range of motion. Neck supple.  Cardiovascular: Normal rate. Exam reveals gallop.  Murmur heard. Pulmonary/Chest: Effort normal.  Abdominal: She exhibits distension. She exhibits no mass. There is tenderness. There is rebound and guarding.  Skin: Skin is warm. No rash noted. No erythema.  Psychiatric: Her mood appears not anxious. Her affect is blunt. She does not exhibit a depressed mood. She expresses no homicidal and no suicidal ideation. She expresses no suicidal plans and no homicidal plans. She is apathetic. She exhibits abnormal remote memory. She has a flat affect.   Data: Lab Results  Component Value Date   WBC 20.7 (H) 10/10/2017   HGB 15.1 10/10/2017   HCT 46.3  10/10/2017   MCV 100.0 10/10/2017   PLT 143 (L) 10/10/2017   Recent Labs  Lab 10/09/17 1053 10/10/17 0419  HGB 17.1* 15.1   Lab Results  Component Value Date   NA 136 10/10/2017   K 5.0 10/10/2017   CL 108 10/10/2017   CO2 19 (L) 10/10/2017   BUN 34 (H) 10/10/2017   CREATININE 2.26 (H) 10/10/2017   Lab Results  Component Value Date   ALT 14 10/10/2017   AST 47 (H) 10/10/2017   ALKPHOS 65 10/10/2017   BILITOT 0.7 10/10/2017   No results for input(s): APTT, INR, PTT in the last 168 hours. CBC Latest Ref Rng & Units 10/10/2017 10/09/2017 07/22/2016  WBC 3.6 - 11.0 K/uL 20.7(H) 20.2(H) 6.3  Hemoglobin 12.0 - 16.0 g/dL 45.4 17.1(H) 14.1  Hematocrit 35.0 - 47.0 % 46.3 53.5(H) 42.1  Platelets 150 - 440 K/uL 143(L) 195 176    STUDIES: Ct Abdomen Pelvis Wo Contrast  Result Date: 10/09/2017 CLINICAL DATA:  Abdominal pain, nausea and vomiting. Tachycardia. Shaking. Leukocytosis. Lactic acidosis.  Smoker. EXAM: CT ABDOMEN AND PELVIS WITHOUT CONTRAST TECHNIQUE: Multidetector CT imaging of the abdomen and pelvis was performed following the standard protocol without IV contrast. COMPARISON:  Abdomen and pelvis radiographs dated 09/23/2014. Portable chest obtained earlier today. FINDINGS: Lower chest: Prominent interstitial markings and mild patchy and linear density at both lung bases. No pleural fluid. Normal sized heart. Hepatobiliary: Sludge in the gallbladder. Minimal diffuse low density of the liver relative to the spleen. Pancreas: Diffusely enlarged and heterogeneous pancreatic head with poorly defined margins. Extensive bilateral peripancreatic soft tissue stranding and fluid with fluid tracking inferiorly along the paracolic gutters into the pelvis and around the spleen and in right lobe of the liver. Spleen: Normal in size without focal abnormality. Adrenals/Urinary Tract: Adrenal glands are unremarkable. Kidneys are normal, without renal calculi, focal lesion, or hydronephrosis. Bladder  is unremarkable. Stomach/Bowel: Diffuse wall thickening involving the second, third and fourth portions of the duodenum with fluid within the lumen. Mild sigmoid colon diverticulosis. Normal appearing appendix. Mild posterior gastric wall thickening. Vascular/Lymphatic: Mild atheromatous arterial calcifications without aneurysm. No enlarged lymph nodes. Reproductive: Uterus and bilateral adnexa are unremarkable. Other: Small to moderate amount of free peritoneal fluid in the abdomen and pelvis, as described above. Musculoskeletal: Mild lumbar and lower thoracic spine degenerative changes. More pronounced facet degenerative changes in the lower lumbar spine. IMPRESSION: 1. Marked changes of acute pancreatitis involving the head of the pancreas with extensive peripancreatic inflammatory changes, associated gastric and duodenal wall thickening and small to moderate amount of free peritoneal fluid. 2. Minimal diffuse hepatic steatosis. 3. Sludge in the gallbladder. 4. Mild sigmoid diverticulosis. 5. Probable combination of pneumonitis and atelectasis at both lung bases with underlying chronic interstitial lung disease. Electronically Signed   By: Beckie Salts M.D.   On: 10/09/2017 14:36   Ct Head Wo Contrast  Result Date: 10/09/2017 CLINICAL DATA:  61 year old female with a history of altered mental status, seizure EXAM: CT HEAD WITHOUT CONTRAST TECHNIQUE: Contiguous axial images were obtained from the base of the skull through the vertex without intravenous contrast. COMPARISON:  None. FINDINGS: Brain: No acute intracranial hemorrhage. No midline shift or mass effect. Gray-white differentiation maintained. Unremarkable appearance of the ventricular system. Volume loss of cerebellum. This finding is incidental, and has been described with chronic Depakote prescription. Vascular: Unremarkable. Skull: No acute fracture.  No aggressive bone lesion identified. Sinuses/Orbits: Unremarkable appearance of the orbits.  Mastoid air cells clear. No middle ear effusion. No significant sinus disease. Other: None IMPRESSION: No CT evidence of acute intracranial abnormality. Preferential volume loss of the cerebellum, which has been described with longstanding Depakote prescription. Electronically Signed   By: Gilmer Mor D.O.   On: 10/09/2017 14:10   Dg Chest Port 1 View  Result Date: 10/09/2017 CLINICAL DATA:  61 year old female with a history of critical limb ischemia EXAM: PORTABLE CHEST 1 VIEW COMPARISON:  01/01/2016 FINDINGS: Cardiomediastinal silhouette unchanged in size and contour. Low lung volumes. No evidence of central vascular congestion. No interlobular septal thickening. No pneumothorax or pleural effusion. No confluent airspace disease. No displaced fracture IMPRESSION: No radiographic evidence of acute cardiopulmonary disease Electronically Signed   By: Gilmer Mor D.O.   On: 10/09/2017 11:29   Dg Toe 2nd Right  Result Date: 10/09/2017 CLINICAL DATA:  Pt unable to answer questions. Hx from chart: Tachycardia, smoker, "dry gangrene" right 2nd toe. EXAM: RIGHT SECOND TOE COMPARISON:  None. FINDINGS: No fracture bone lesion. The joints are normally spaced and aligned. There are no areas  of bone resorption to suggest osteomyelitis. There is mild soft tissue swelling of the distal second toe. No soft tissue air. IMPRESSION: 1. No fracture or dislocation. 2. No evidence of osteomyelitis. Electronically Signed   By: Amie Portland M.D.   On: 10/09/2017 11:31   @IMAGES @  Assessment: 1. Acute pancreatitis - I believe this is more likely due to biliary sludge rather than from the drug Atripla which the patient reports to be taking for several years. Sludge is likely the cause of her reported previous admissions for pancreatitis as well.  Recommendations:  1. Surgical consult for elective cholecystectomy.  2. Follow clinically.  3. Maintain NPO status, IV analgesia and brisk IV hydration.  4. Will follow.    Thank you for the consult. Please call with questions or concerns.  Rosina Lowenstein, MD  10/10/2017 4:53 PM

## 2017-10-10 NOTE — Progress Notes (Signed)
Pt alert. Resting in room. IV infusing NS 100 mL/hr. Pt on 1L O2. Sats in the high 90s. Pt complains of abdominal pain. PRN oxycodone given. Pt NPO except sips with meds. No other complaints at this time. Will continue to monitor.

## 2017-10-10 NOTE — Progress Notes (Signed)
LCSW received a call from patient Guardian Melanie Hahn [phone] and she was redirected to call patient nurse for medical information.  Delta Air LinesClaudine Marilynn Ekstein LCSW [phone]

## 2017-10-10 NOTE — Care Management Note (Signed)
Case Management Note  Patient Details  Name: Melanie RobertSheila Hahn MRN: [id number] Date of Birth: 03/08/1956  Subjective/Objective:     Verified by phone call that Ms Melanie Hahn is a [facility], [phone]. Her legal guardian is Melanie Hahn [phone].                Action/Plan:   Expected Discharge Date:                  Expected Discharge Plan:     In-House Referral:     Discharge planning Services     Post Acute Care Choice:    Choice offered to:     DME Arranged:    DME Agency:     HH Arranged:    HH Agency:     Status of Service:     If discussed at [employer] Length of Stay Meetings, dates discussed:    Additional Comments:  Melanie Hahn A, RN 10/10/2017, 4:11 PM

## 2017-10-11 LAB — COMPREHENSIVE METABOLIC PANEL
ALBUMIN: 2.1 g/dL — AB (ref 3.5–5.0)
ALK PHOS: 50 U/L (ref 38–126)
ALT: 17 U/L (ref 14–54)
AST: 42 U/L — ABNORMAL HIGH (ref 15–41)
Anion gap: 6 (ref 5–15)
BILIRUBIN TOTAL: 0.8 mg/dL (ref 0.3–1.2)
BUN: 40 mg/dL — AB (ref 6–20)
CALCIUM: 8.6 mg/dL — AB (ref 8.9–10.3)
CO2: 21 mmol/L — AB (ref 22–32)
CREATININE: 2.3 mg/dL — AB (ref 0.44–1.00)
Chloride: 111 mmol/L (ref 101–111)
GFR calc Af Amer: 25 mL/min — ABNORMAL LOW (ref >60)
GFR calc non Af Amer: 22 mL/min — ABNORMAL LOW (ref >60)
GLUCOSE: 105 mg/dL — AB (ref 65–99)
Potassium: 4.2 mmol/L (ref 3.5–5.1)
SODIUM: 138 mmol/L (ref 135–145)
TOTAL PROTEIN: 5.4 g/dL — AB (ref 6.5–8.1)

## 2017-10-11 LAB — CBC
HEMATOCRIT: 40.3 % (ref 35.0–47.0)
HEMOGLOBIN: 13.5 g/dL (ref 12.0–16.0)
MCH: 33.5 pg (ref 26.0–34.0)
MCHC: 33.5 g/dL (ref 32.0–36.0)
MCV: 99.9 fL (ref 80.0–100.0)
Platelets: 106 10*3/uL — ABNORMAL LOW (ref 150–440)
RBC: 4.03 MIL/uL (ref 3.80–5.20)
RDW: 14.2 % (ref 11.5–14.5)
WBC: 15 10*3/uL — ABNORMAL HIGH (ref 3.6–11.0)

## 2017-10-11 LAB — GLUCOSE, CAPILLARY: GLUCOSE-CAPILLARY: 100 mg/dL — AB (ref 65–99)

## 2017-10-11 LAB — URINE CULTURE: CULTURE: NO GROWTH

## 2017-10-11 LAB — LIPASE, BLOOD: Lipase: 80 U/L — ABNORMAL HIGH (ref 11–51)

## 2017-10-11 MED ORDER — MEROPENEM 1 G IV SOLR
1.0000 g | Freq: Two times a day (BID) | INTRAVENOUS | Status: DC
  Administered 2017-10-11 – 2017-10-13 (×4): 1 g via INTRAVENOUS
  Filled 2017-10-11 (×5): qty 1

## 2017-10-11 NOTE — Consult Note (Signed)
[facility] Infectious Disease     Reason for Consult: HIV, pancreatitis   Referring Physician: Jeronimo Greaves Date of Admission:  10/09/2017   Principal Problem:   Acute pancreatitis Active Problems:   Abdominal pain   Intractable nausea and vomiting   HIV antibody positive ([place])   HPI: Melanie Hahn is a 61 y.o. female admitted with abd pain and found to have severe pancreatitis.  She has well controlled HIV On Atripla with her most recent CD4  of 1597 and viral load of 42 in June 2018.  Currently reports improving pain.   Past Medical History:  Diagnosis Date  . Hepatitis C   . HIV (human immunodeficiency virus infection) ([place])   . Obesity   . Schizophrenia ([place])    History reviewed. No pertinent surgical history. Social History   Tobacco Use  . Smoking status: Current Some Day Smoker    Types: Cigarettes  . Smokeless tobacco: Never Used  Substance Use Topics  . Alcohol use: No  . Drug use: No   Family History  Problem Relation Age of Onset  . Breast cancer Neg Hx     Allergies:  Allergies  Allergen Reactions  . Penicillins Other (See Comments)    Has patient had a PCN reaction causing immediate rash, facial/tongue/throat swelling, SOB or lightheadedness with hypotension: unknown Has patient had a PCN reaction causing severe rash involving mucus membranes or skin necrosis: unknown Has patient had a PCN reaction that required hospitalization: unknown Has patient had a PCN reaction occurring within the last 10 years: unknown If all of the above answers are "NO", then may proceed with Cephalosporin use.     Current antibiotics: Antibiotics Given (last 72 hours)    Date/Time Action Medication Dose Rate   10/09/17 1203 New Bag/Given   aztreonam (AZACTAM) 2 g in dextrose 5 % 50 mL IVPB 2 g 100 mL/hr   10/09/17 1205 New Bag/Given   levofloxacin (LEVAQUIN) IVPB 750 mg 750 mg 100 mL/hr   10/09/17 1211 New Bag/Given   vancomycin (VANCOCIN) IVPB 1000 mg/200 mL premix  1,000 mg 200 mL/hr   10/09/17 1802 New Bag/Given   vancomycin (VANCOCIN) IVPB 1000 mg/200 mL premix 1,000 mg 200 mL/hr   10/09/17 2021 Given   efavirenz-emtricitabine-tenofovir (ATRIPLA) 600-200-300 MG per tablet 1 tablet 1 tablet    10/09/17 2104 New Bag/Given   aztreonam (AZACTAM) 1 g in dextrose 5 % 50 mL IVPB 1 g 100 mL/hr   10/10/17 0447 New Bag/Given   aztreonam (AZACTAM) 1 g in dextrose 5 % 50 mL IVPB 1 g 100 mL/hr   10/10/17 0549 New Bag/Given   vancomycin (VANCOCIN) IVPB 1000 mg/200 mL premix 1,000 mg 200 mL/hr   10/10/17 0919 Given   efavirenz-emtricitabine-tenofovir (ATRIPLA) 600-200-300 MG per tablet 1 tablet 1 tablet    10/11/17 1205 New Bag/Given   levofloxacin (LEVAQUIN) IVPB 750 mg 750 mg 100 mL/hr      MEDICATIONS: . benztropine  1 mg Oral BID  . divalproex  1,000 mg Oral BID  . heparin  5,000 Units Subcutaneous Q8H  . pantoprazole (PROTONIX) IV  40 mg Intravenous Q12H  . QUEtiapine  200 mg Oral BH-q7a  . QUEtiapine  400 mg Oral QHS  . traZODone  200 mg Oral QHS    Review of Systems - 11 systems reviewed and negative per HPI   OBJECTIVE: Temp:  [98.7 F (37.1 C)-99.4 F (37.4 C)] 98.7 F (37.1 C) (11/05 0814) Pulse Rate:  [120-131] 122 (11/05 0814)  Resp:  [16] 16 (11/05 0814) BP: (108-115)/(71-76) 112/71 (11/05 0814) SpO2:  [90 %-94 %] 92 % (11/05 0815) Weight:  [93.7 kg (206 lb 8 oz)] 93.7 kg (206 lb 8 oz) (11/05 0445) Physical Exam  Constitutional:  oriented to person, place, and time.obese, sleepy HENT: [place]/AT, PERRLA, no scleral icterus Mouth/Throat: Oropharynx is clear and moist. No oropharyngeal exudate.  Cardiovascular: Normal rate, regular rhythm and normal heart sounds. Exam reveals no gallop and no friction rub.  No murmur heard.  Pulmonary/Chest: Effort normal and breath sounds normal. No respiratory distress.  has no wheezes.  Neck = supple, no nuchal rigidity Abdominal: obese, mod distention, mild ttp Lymphadenopathy: no cervical  adenopathy. No axillary adenopathy Neurological: alert and oriented to person, place, and time.  Skin: Skin is warm and dry. No rash noted. No erythema.  Psychiatric: a normal mood and affect.  behavior is normal.   LABS: Results for orders placed or performed during the hospital encounter of 10/09/17 (from the past 48 hour(s))  Lactic acid, plasma     Status: Abnormal   Collection Time: 10/09/17  2:35 PM  Result Value Ref Range   Lactic Acid, Venous 3.0 (HH) 0.5 - 1.9 mmol/L    Comment: CRITICAL RESULT CALLED TO, READ BACK BY AND VERIFIED WITH ALICIA GRANGER ON 66/5/99 AT 1617 [facility]   Lipase, blood     Status: Abnormal   Collection Time: 10/10/17  4:19 AM  Result Value Ref Range   Lipase 153 (H) 11 - 51 U/L  Comprehensive metabolic panel     Status: Abnormal   Collection Time: 10/10/17  4:19 AM  Result Value Ref Range   Sodium 136 135 - 145 mmol/L   Potassium 5.0 3.5 - 5.1 mmol/L   Chloride 108 101 - 111 mmol/L   CO2 19 (L) 22 - 32 mmol/L   Glucose, Bld 105 (H) 65 - 99 mg/dL   BUN 34 (H) 6 - 20 mg/dL   Creatinine, Ser 2.26 (H) 0.44 - 1.00 mg/dL   Calcium 8.8 (L) 8.9 - 10.3 mg/dL   Total Protein 5.8 (L) 6.5 - 8.1 g/dL   Albumin 2.4 (L) 3.5 - 5.0 g/dL   AST 47 (H) 15 - 41 U/L   ALT 14 14 - 54 U/L   Alkaline Phosphatase 65 38 - 126 U/L   Total Bilirubin 0.7 0.3 - 1.2 mg/dL   GFR calc non Af Amer 22 (L) >60 mL/min   GFR calc Af Amer 26 (L) >60 mL/min    Comment: (NOTE) The eGFR has been calculated using the CKD EPI equation. This calculation has not been validated in all clinical situations. eGFR's persistently <60 mL/min signify possible Chronic Kidney Disease.    Anion gap 9 5 - 15  CBC     Status: Abnormal   Collection Time: 10/10/17  4:19 AM  Result Value Ref Range   WBC 20.7 (H) 3.6 - 11.0 K/uL   RBC 4.63 3.80 - 5.20 MIL/uL   Hemoglobin 15.1 12.0 - 16.0 g/dL   HCT 46.3 35.0 - 47.0 %   MCV 100.0 80.0 - 100.0 fL   MCH 32.7 26.0 - 34.0 pg   MCHC 32.7 32.0 - 36.0 g/dL    RDW 14.4 11.5 - 14.5 %   Platelets 143 (L) 150 - 440 K/uL  Glucose, capillary     Status: Abnormal   Collection Time: 10/10/17  7:28 AM  Result Value Ref Range   Glucose-Capillary 121 (H) 65 - 99 mg/dL  Comment 1 Notify RN   CBC     Status: Abnormal   Collection Time: 10/11/17  5:10 AM  Result Value Ref Range   WBC 15.0 (H) 3.6 - 11.0 K/uL   RBC 4.03 3.80 - 5.20 MIL/uL   Hemoglobin 13.5 12.0 - 16.0 g/dL   HCT 40.3 35.0 - 47.0 %   MCV 99.9 80.0 - 100.0 fL   MCH 33.5 26.0 - 34.0 pg   MCHC 33.5 32.0 - 36.0 g/dL   RDW 14.2 11.5 - 14.5 %   Platelets 106 (L) 150 - 440 K/uL  Comprehensive metabolic panel     Status: Abnormal   Collection Time: 10/11/17  5:10 AM  Result Value Ref Range   Sodium 138 135 - 145 mmol/L   Potassium 4.2 3.5 - 5.1 mmol/L   Chloride 111 101 - 111 mmol/L   CO2 21 (L) 22 - 32 mmol/L   Glucose, Bld 105 (H) 65 - 99 mg/dL   BUN 40 (H) 6 - 20 mg/dL   Creatinine, Ser 2.30 (H) 0.44 - 1.00 mg/dL   Calcium 8.6 (L) 8.9 - 10.3 mg/dL   Total Protein 5.4 (L) 6.5 - 8.1 g/dL   Albumin 2.1 (L) 3.5 - 5.0 g/dL   AST 42 (H) 15 - 41 U/L   ALT 17 14 - 54 U/L   Alkaline Phosphatase 50 38 - 126 U/L   Total Bilirubin 0.8 0.3 - 1.2 mg/dL   GFR calc non Af Amer 22 (L) >60 mL/min   GFR calc Af Amer 25 (L) >60 mL/min    Comment: (NOTE) The eGFR has been calculated using the CKD EPI equation. This calculation has not been validated in all clinical situations. eGFR's persistently <60 mL/min signify possible Chronic Kidney Disease.    Anion gap 6 5 - 15  Lipase, blood     Status: Abnormal   Collection Time: 10/11/17  5:10 AM  Result Value Ref Range   Lipase 80 (H) 11 - 51 U/L  Glucose, capillary     Status: Abnormal   Collection Time: 10/11/17  8:13 AM  Result Value Ref Range   Glucose-Capillary 100 (H) 65 - 99 mg/dL   Comment 1 Notify RN    No components found for: ESR, C REACTIVE PROTEIN MICRO: Recent Results (from the past 720 hour(s))  Blood Culture (routine x 2)      Status: None (Preliminary result)   Collection Time: 10/09/17 10:53 AM  Result Value Ref Range Status   Specimen Description BLOOD RIGHT ASSIST CONTROL  Final   Special Requests   Final    BOTTLES DRAWN AEROBIC AND ANAEROBIC Blood Culture results may not be optimal due to an inadequate volume of blood received in culture bottles   Culture NO GROWTH 2 DAYS  Final   Report Status PENDING  Incomplete  Urine culture     Status: None   Collection Time: 10/09/17 10:53 AM  Result Value Ref Range Status   Specimen Description URINE, RANDOM  Final   Special Requests NONE  Final   Culture   Final    NO GROWTH Performed at [facility], [address], [place], [place] [address]    Report Status 10/11/2017 FINAL  Final  Blood Culture (routine x 2)     Status: None (Preliminary result)   Collection Time: 10/09/17 11:24 AM  Result Value Ref Range Status   Specimen Description BLOOD LEFT ASSIST CONTROL  Final   Special Requests   Final  BOTTLES DRAWN AEROBIC AND ANAEROBIC Blood Culture adequate volume   Culture NO GROWTH 2 DAYS  Final   Report Status PENDING  Incomplete    IMAGING: Ct Abdomen Pelvis Wo Contrast  Result Date: 10/09/2017 CLINICAL DATA:  Abdominal pain, nausea and vomiting. Tachycardia. Shaking. Leukocytosis. Lactic acidosis. Smoker. EXAM: CT ABDOMEN AND PELVIS WITHOUT CONTRAST TECHNIQUE: Multidetector CT imaging of the abdomen and pelvis was performed following the standard protocol without IV contrast. COMPARISON:  Abdomen and pelvis radiographs dated 09/23/2014. Portable chest obtained earlier today. FINDINGS: Lower chest: Prominent interstitial markings and mild patchy and linear density at both lung bases. No pleural fluid. Normal sized heart. Hepatobiliary: Sludge in the gallbladder. Minimal diffuse low density of the liver relative to the spleen. Pancreas: Diffusely enlarged and heterogeneous pancreatic head with poorly defined margins. Extensive bilateral  peripancreatic soft tissue stranding and fluid with fluid tracking inferiorly along the paracolic gutters into the pelvis and around the spleen and in right lobe of the liver. Spleen: Normal in size without focal abnormality. Adrenals/Urinary Tract: Adrenal glands are unremarkable. Kidneys are normal, without renal calculi, focal lesion, or hydronephrosis. Bladder is unremarkable. Stomach/Bowel: Diffuse wall thickening involving the second, third and fourth portions of the duodenum with fluid within the lumen. Mild sigmoid colon diverticulosis. Normal appearing appendix. Mild posterior gastric wall thickening. Vascular/Lymphatic: Mild atheromatous arterial calcifications without aneurysm. No enlarged lymph nodes. Reproductive: Uterus and bilateral adnexa are unremarkable. Other: Small to moderate amount of free peritoneal fluid in the abdomen and pelvis, as described above. Musculoskeletal: Mild lumbar and lower thoracic spine degenerative changes. More pronounced facet degenerative changes in the lower lumbar spine. IMPRESSION: 1. Marked changes of acute pancreatitis involving the head of the pancreas with extensive peripancreatic inflammatory changes, associated gastric and duodenal wall thickening and small to moderate amount of free peritoneal fluid. 2. Minimal diffuse hepatic steatosis. 3. Sludge in the gallbladder. 4. Mild sigmoid diverticulosis. 5. Probable combination of pneumonitis and atelectasis at both lung bases with underlying chronic interstitial lung disease. Electronically Signed   By: Claudie Revering M.D.   On: 10/09/2017 14:36   Ct Head Wo Contrast  Result Date: 10/09/2017 CLINICAL DATA:  61 year old female with a history of altered mental status, seizure EXAM: CT HEAD WITHOUT CONTRAST TECHNIQUE: Contiguous axial images were obtained from the base of the skull through the vertex without intravenous contrast. COMPARISON:  None. FINDINGS: Brain: No acute intracranial hemorrhage. No midline shift or  mass effect. Gray-white differentiation maintained. Unremarkable appearance of the ventricular system. Volume loss of cerebellum. This finding is incidental, and has been described with chronic Depakote prescription. Vascular: Unremarkable. Skull: No acute fracture.  No aggressive bone lesion identified. Sinuses/Orbits: Unremarkable appearance of the orbits. Mastoid air cells clear. No middle ear effusion. No significant sinus disease. Other: None IMPRESSION: No CT evidence of acute intracranial abnormality. Preferential volume loss of the cerebellum, which has been described with longstanding Depakote prescription. Electronically Signed   By: Corrie Mckusick D.O.   On: 10/09/2017 14:10   Dg Chest Port 1 View  Result Date: 10/09/2017 CLINICAL DATA:  61 year old female with a history of critical limb ischemia EXAM: PORTABLE CHEST 1 VIEW COMPARISON:  01/01/2016 FINDINGS: Cardiomediastinal silhouette unchanged in size and contour. Low lung volumes. No evidence of central vascular congestion. No interlobular septal thickening. No pneumothorax or pleural effusion. No confluent airspace disease. No displaced fracture IMPRESSION: No radiographic evidence of acute cardiopulmonary disease Electronically Signed   By: Corrie Mckusick D.O.   On: 10/09/2017 11:29  Dg Toe 2nd Right  Result Date: 10/09/2017 CLINICAL DATA:  Pt unable to answer questions. Hx from chart: Tachycardia, smoker, "dry gangrene" right 2nd toe. EXAM: RIGHT SECOND TOE COMPARISON:  None. FINDINGS: No fracture bone lesion. The joints are normally spaced and aligned. There are no areas of bone resorption to suggest osteomyelitis. There is mild soft tissue swelling of the distal second toe. No soft tissue air. IMPRESSION: 1. No fracture or dislocation. 2. No evidence of osteomyelitis. Electronically Signed   By: Lajean Manes M.D.   On: 10/09/2017 11:31    Assessment:   Melanie Hahn is a 61 y.o. female with well controlled HIV on atripla for several  years, most recent  CD4  of 1597 and viral load of 42 in June 2018 now with severe pancreatitis. Unlikely related to the atripla given GI and surgery consults.   Recommendations Can hold atripla for now but will discuss with her Milo and consider change to an integrase inhibitor combo. Will follow Thank you very much for allowing me to participate in the care of this patient. Please call with questions.   Cheral Marker. Ola Spurr, MD

## 2017-10-11 NOTE — Progress Notes (Signed)
Subjective: Patient seen for f/u acute pancreatitis. Patient says "feel better". WBC decr to 15k. Appreciate surgery input.   Objective: Vital signs in last 24 hours: Temp:  [98.7 F (37.1 C)-99.4 F (37.4 C)] 98.7 F (37.1 C) (11/05 0814) Pulse Rate:  [120-131] 122 (11/05 0814) Resp:  [16] 16 (11/05 0814) BP: (108-115)/(71-76) 112/71 (11/05 0814) SpO2:  [90 %-94 %] 92 % (11/05 0815) Weight:  [93.7 kg (206 lb 8 oz)] 93.7 kg (206 lb 8 oz) (11/05 0445) Blood pressure 112/71, pulse (!) 122, temperature 98.7 F (37.1 C), temperature source Oral, resp. rate 16, height 5\' 7"  (1.702 m), weight 93.7 kg (206 lb 8 oz), SpO2 92 %.   Intake/Output from previous day: 11/04 0701 - 11/05 0700 In: -  Out: 202 [Urine:202]  Intake/Output this shift: Total I/O In: 480 [P.O.:480] Out: 300 [Urine:300]   General appearance:  Somnolent, oriented x 3. Resp:  CTA Cardio: RRR nl S1, S2 GI:  +Epigastric tenderness with mild guarding, no rebound. BS hypoactive. Extremities:  1+ edema.   Lab Results: Results for orders placed or performed during the hospital encounter of 10/09/17 (from the past 24 hour(s))  CBC     Status: Abnormal   Collection Time: 10/11/17  5:10 AM  Result Value Ref Range   WBC 15.0 (H) 3.6 - 11.0 K/uL   RBC 4.03 3.80 - 5.20 MIL/uL   Hemoglobin 13.5 12.0 - 16.0 g/dL   HCT 16.140.3 09.635.0 - 04.547.0 %   MCV 99.9 80.0 - 100.0 fL   MCH 33.5 26.0 - 34.0 pg   MCHC 33.5 32.0 - 36.0 g/dL   RDW 40.914.2 81.111.5 - 91.414.5 %   Platelets 106 (L) 150 - 440 K/uL  Comprehensive metabolic panel     Status: Abnormal   Collection Time: 10/11/17  5:10 AM  Result Value Ref Range   Sodium 138 135 - 145 mmol/L   Potassium 4.2 3.5 - 5.1 mmol/L   Chloride 111 101 - 111 mmol/L   CO2 21 (L) 22 - 32 mmol/L   Glucose, Bld 105 (H) 65 - 99 mg/dL   BUN 40 (H) 6 - 20 mg/dL   Creatinine, Ser 7.822.30 (H) 0.44 - 1.00 mg/dL   Calcium 8.6 (L) 8.9 - 10.3 mg/dL   Total Protein 5.4 (L) 6.5 - 8.1 g/dL   Albumin 2.1 (L) 3.5 -  5.0 g/dL   AST 42 (H) 15 - 41 U/L   ALT 17 14 - 54 U/L   Alkaline Phosphatase 50 38 - 126 U/L   Total Bilirubin 0.8 0.3 - 1.2 mg/dL   GFR calc non Af Amer 22 (L) >60 mL/min   GFR calc Af Amer 25 (L) >60 mL/min   Anion gap 6 5 - 15  Lipase, blood     Status: Abnormal   Collection Time: 10/11/17  5:10 AM  Result Value Ref Range   Lipase 80 (H) 11 - 51 U/L  Glucose, capillary     Status: Abnormal   Collection Time: 10/11/17  8:13 AM  Result Value Ref Range   Glucose-Capillary 100 (H) 65 - 99 mg/dL   Comment 1 Notify RN      Recent Labs    10/09/17 1053 10/10/17 0419 10/11/17 0510  WBC 20.2* 20.7* 15.0*  HGB 17.1* 15.1 13.5  HCT 53.5* 46.3 40.3  PLT 195 143* 106*   BMET Recent Labs    10/09/17 1226 10/10/17 0419 10/11/17 0510  NA 138 136 138  K 4.1 5.0 4.2  CL 105 108 111  CO2 19* 19* 21*  GLUCOSE 153* 105* 105*  BUN 24* 34* 40*  CREATININE 1.95* 2.26* 2.30*  CALCIUM 9.8 8.8* 8.6*   LFT Recent Labs    10/11/17 0510  PROT 5.4*  ALBUMIN 2.1*  AST 42*  ALT 17  ALKPHOS 50  BILITOT 0.8   PT/INR No results for input(s): LABPROT, INR in the last 72 hours. Hepatitis Panel No results for input(s): HEPBSAG, HCVAB, HEPAIGM, HEPBIGM in the last 72 hours. C-Diff No results for input(s): CDIFFTOX in the last 72 hours. No results for input(s): CDIFFPCR in the last 72 hours.   Studies/Results: No results found.  Scheduled Inpatient Medications:   . benztropine  1 mg Oral BID  . divalproex  1,000 mg Oral BID  . heparin  5,000 Units Subcutaneous Q8H  . pantoprazole (PROTONIX) IV  40 mg Intravenous Q12H  . QUEtiapine  200 mg Oral BH-q7a  . QUEtiapine  400 mg Oral QHS  . traZODone  200 mg Oral QHS    Continuous Inpatient Infusions:   . sodium chloride 100 mL/hr at 10/11/17 0910  . meropenem (MERREM) IV    . sodium chloride      PRN Inpatient Medications:  acetaminophen **OR** acetaminophen, bisacodyl, hydrALAZINE, HYDROmorphone (DILAUDID) injection,  iopamidol, ipratropium-albuterol, LORazepam, ondansetron **OR** ondansetron (ZOFRAN) IV, oxyCODONE-acetaminophen  Miscellaneous:   Assessment:  1. 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 Slowly improving.  Plan:  1. Continue conservative mgmt. 2. Elective cholecystectomy at later time. 3. Maintain NPO until WBC normalizes and patient no longer tender or in need of IV analgesics.  4. Will sign off. Call back if further help needed.   Teodoro K. Norma Fredrickson, M.D. 10/11/2017, 4:43 PM

## 2017-10-11 NOTE — Progress Notes (Signed)
[facility] [place] [facility]   PATIENT NAME: Melanie Hahn    MR#:  [id number]  DATE OF BIRTH:  [birth year]  SUBJECTIVE:   Patient here due to abdominal pain and nausea and noted to have CT scan findings suggestive of acute pancreatitis. Basis trended down. Patient's abdominal pain improved since admission. No nausea or vomiting. Still has some mild abdominal distention.  REVIEW OF SYSTEMS:    Review of Systems  Constitutional: Negative for chills and fever.  HENT: Negative for congestion and tinnitus.   Eyes: Negative for blurred vision and double vision.  Respiratory: Negative for cough, shortness of breath and wheezing.   Cardiovascular: Negative for chest pain, orthopnea and PND.  Gastrointestinal: Negative for abdominal pain, diarrhea, nausea and vomiting.  Genitourinary: Negative for dysuria and hematuria.  Neurological: Negative for dizziness, sensory change and focal weakness.  All other systems reviewed and are negative.   Nutrition: NPO Tolerating Diet: No Tolerating PT: Await Eval.   DRUG ALLERGIES:   Allergies  Allergen Reactions  . Penicillins Other (See Comments)    Has patient had a PCN reaction causing immediate rash, facial/tongue/throat swelling, SOB or lightheadedness with hypotension: unknown Has patient had a PCN reaction causing severe rash involving mucus membranes or skin necrosis: unknown Has patient had a PCN reaction that required hospitalization: unknown Has patient had a PCN reaction occurring within the last 10 years: unknown If all of the above answers are "NO", then may proceed with Cephalosporin use.     VITALS:  Blood pressure 112/71, pulse (!) 122, temperature 98.7 F (37.1 C), temperature source Oral, resp. rate 16, height 5\' 7"  (1.702 m), weight 93.7 kg (206 lb 8 oz), SpO2 92 %.  PHYSICAL EXAMINATION:   Physical Exam  GENERAL:  61 y.o.-year-old patient lying in bed in no acute distress.  EYES: Pupils equal,  round, reactive to light and accommodation. No scleral icterus. Extraocular muscles intact.  HEENT: Head atraumatic, normocephalic. Oropharynx and nasopharynx clear.  NECK:  Supple, no jugular venous distention. No thyroid enlargement, no tenderness.  LUNGS: Normal breath sounds bilaterally, no wheezing, rales, rhonchi. No use of accessory muscles of respiration.  CARDIOVASCULAR: S1, S2 normal. No murmurs, rubs, or gallops.  ABDOMEN: Soft, NT, slightly distended. Bowel sounds Hypoactive. No organomegaly or mass.  EXTREMITIES: No cyanosis, clubbing or edema b/l.    NEUROLOGIC: Cranial nerves II through XII are intact. No focal Motor or sensory deficits b/l.  Globally weak.  PSYCHIATRIC: The patient is alert and oriented x 3.  SKIN: No obvious rash, lesion, or ulcer.    LABORATORY PANEL:   CBC Recent Labs  Lab 10/11/17 0510  WBC 15.0*  HGB 13.5  HCT 40.3  PLT 106*   ------------------------------------------------------------------------------------------------------------------  Chemistries  Recent Labs  Lab 10/11/17 0510  NA 138  K 4.2  CL 111  CO2 21*  GLUCOSE 105*  BUN 40*  CREATININE 2.30*  CALCIUM 8.6*  AST 42*  ALT 17  ALKPHOS 50  BILITOT 0.8   ------------------------------------------------------------------------------------------------------------------  Cardiac Enzymes Recent Labs  Lab 10/09/17 1226  TROPONINI <0.03   ------------------------------------------------------------------------------------------------------------------  RADIOLOGY:  No results found.   ASSESSMENT AND PLAN:   61 year old female with past medical history of HIV, schizophrenia, hepatitis C who presents to the hospital due to abdominal pain, tachycardia and was noted to be in acute pancreatitis.  1. Acute pancreatitis- suspected to be secondary to microlithiasis is patient had sludge in the gallbladder. No evidence of alcohol abuse, LFTs ARE stable.  Questionable related to  HIV meds but unlikely.  -Appreciated gastroenterology and also surgical input. Continue supportive care with IV fluids, antiemetics, pain control. Lipase is trended down. We'll start on clear liquids today. Patient will eventually need a cholecystectomy once her pancreatitis has resolved.  2. Leukocytosis-stress mediated from the acute pancreatitis. -We will empirically continue IV meropenem given her severity of hepatitis and concern for pancreatic necrosis. -Follow white cell count.  3. Acute kidney injury-secondary to the acute pancreatitis. Continue IV fluids, follow BUN and creatinine urine output. Renal dose meds, avoid nephrotoxins.  4. History of HIV-we'll DC Atripla for now due to renal failure.  - appreciate ID input and last CD4 count was 1597 viral load was 42 in June 2018.  5. History of schizophrenia-continue Depakote, Seroquel, Cogentin.   All the records are reviewed and case discussed with Care Management/Social Worker. Management plans discussed with the patient, family and they are in agreement.  CODE STATUS: Full code  DVT Prophylaxis: Heparin subcutaneous  TOTAL TIME TAKING CARE OF THIS PATIENT: 30 minutes.   POSSIBLE D/C IN 2-3 DAYS, DEPENDING ON CLINICAL CONDITION.   [name],[name] on 10/11/2017 at 2:19 PM  Between 7am to 6pm - Pager - [phone]  After 6pm go to [email or web address] - Social research officer, [email or web address] [email or web address]  [facility] [place] [facility]  Office  [phone]  CC: Primary care physician; [name], [name], MD

## 2017-10-11 NOTE — Progress Notes (Signed)
CC: Acute pancreatitis Subjective: This patient with a diagnosis of acute pancreatitis. Subjectively she is complaining of abdominal pain mostly in the left upper quadrant and epigastrium with some nausea she has tolerated a clear liquid diet however. She denies fevers or chills. He has not had a bowel movement today. I spoke to prime doc concerning her care.  Objective: Vital signs in last 24 hours: Temp:  [98.7 F (37.1 C)-99.4 F (37.4 C)] 98.7 F (37.1 C) (11/05 0814) Pulse Rate:  [120-131] 122 (11/05 0814) Resp:  [16] 16 (11/05 0814) BP: (108-115)/(71-76) 112/71 (11/05 0814) SpO2:  [90 %-94 %] 92 % (11/05 0815) Weight:  [206 lb 8 oz (93.7 kg)] 206 lb 8 oz (93.7 kg) (11/05 0445) Last BM Date: 10/08/17  Intake/Output from previous day: 11/04 0701 - 11/05 0700 In: -  Out: 202 [Urine:202] Intake/Output this shift: Total I/O In: 240 [P.O.:240] Out: -   Physical exam:  Vital signs are reviewed and stable heart rate is elevated however. She is afebrile.  She appears uncomfortable but in no acute distress. No icterus no jaundice Abdomen is distended and tympanitic minimally tender no peritoneal signs Calves are nontender  Lab Results: CBC  Recent Labs    10/10/17 0419 10/11/17 0510  WBC 20.7* 15.0*  HGB 15.1 13.5  HCT 46.3 40.3  PLT 143* 106*   BMET Recent Labs    10/10/17 0419 10/11/17 0510  NA 136 138  K 5.0 4.2  CL 108 111  CO2 19* 21*  GLUCOSE 105* 105*  BUN 34* 40*  CREATININE 2.26* 2.30*  CALCIUM 8.8* 8.6*   PT/INR No results for input(s): LABPROT, INR in the last 72 hours. ABG No results for input(s): PHART, HCO3 in the last 72 hours.  Invalid input(s): PCO2, PO2  Studies/Results: No results found.  Anti-infectives: Anti-infectives (From admission, onward)   Start     Dose/Rate Route Frequency Ordered Stop   10/11/17 1800  meropenem (MERREM) 1 g in sodium chloride 0.9 % 100 mL IVPB     1 g 200 mL/hr over 30 Minutes Intravenous Every 12  hours 10/11/17 1402     10/11/17 1200  levofloxacin (LEVAQUIN) IVPB 750 mg  Status:  Discontinued     750 mg 100 mL/hr over 90 Minutes Intravenous Every 48 hours 10/09/17 1352 10/11/17 1402   10/11/17 0549  vancomycin (VANCOCIN) IVPB 1000 mg/200 mL premix  Status:  Discontinued     1,000 mg 200 mL/hr over 60 Minutes Intravenous Every 24 hours 10/10/17 1012 10/10/17 1157   10/09/17 2000  aztreonam (AZACTAM) 1 g in dextrose 5 % 50 mL IVPB  Status:  Discontinued     1 g 100 mL/hr over 30 Minutes Intravenous Every 8 hours 10/09/17 1353 10/10/17 1157   10/09/17 1845  efavirenz-emtricitabine-tenofovir (ATRIPLA) 600-200-300 MG per tablet 1 tablet  Status:  Discontinued     1 tablet Oral Daily 10/09/17 1702 10/10/17 1130   10/09/17 1800  vancomycin (VANCOCIN) IVPB 1000 mg/200 mL premix  Status:  Discontinued     1,000 mg 200 mL/hr over 60 Minutes Intravenous Every 12 hours 10/09/17 1355 10/10/17 1012   10/09/17 1145  levofloxacin (LEVAQUIN) IVPB 750 mg     750 mg 100 mL/hr over 90 Minutes Intravenous  Once 10/09/17 1140 10/09/17 1414   10/09/17 1145  aztreonam (AZACTAM) 2 g in dextrose 5 % 50 mL IVPB     2 g 100 mL/hr over 30 Minutes Intravenous  Once 10/09/17 1140 10/09/17 1233  10/09/17 1145  vancomycin (VANCOCIN) IVPB 1000 mg/200 mL premix     1,000 mg 200 mL/hr over 60 Minutes Intravenous  Once 10/09/17 1140 10/09/17 1414      Assessment/Plan:  Labs personally reviewed. Creatinine remains elevated. White blood cell count remains elevated. There is been a fall in her hemoglobin and hematocrit as well likely due to hemodilution.  This a patient with acute pancreatitis. While this may be related to biliary tract disease there is no role for surgical intervention at this time due to the acute pancreatitis and the degree of inflammation present. Point I believe that she would warrant cholecystectomy but that cannot be and should not be done at this time. Supportive care including nutrition  should be entertained but there is no surgical needs at this time.  Lattie Hawichard E Analisia Kingsford, MD, FACS  10/11/2017

## 2017-10-12 ENCOUNTER — Inpatient Hospital Stay: Payer: Medicare Other

## 2017-10-12 LAB — COMPREHENSIVE METABOLIC PANEL
ALBUMIN: 2 g/dL — AB (ref 3.5–5.0)
ALK PHOS: 56 U/L (ref 38–126)
ALT: 15 U/L (ref 14–54)
AST: 29 U/L (ref 15–41)
Anion gap: 5 (ref 5–15)
BILIRUBIN TOTAL: 0.7 mg/dL (ref 0.3–1.2)
BUN: 30 mg/dL — AB (ref 6–20)
CALCIUM: 9.1 mg/dL (ref 8.9–10.3)
CO2: 18 mmol/L — AB (ref 22–32)
CREATININE: 1.58 mg/dL — AB (ref 0.44–1.00)
Chloride: 117 mmol/L — ABNORMAL HIGH (ref 101–111)
GFR calc Af Amer: 40 mL/min — ABNORMAL LOW (ref >60)
GFR calc non Af Amer: 34 mL/min — ABNORMAL LOW (ref >60)
GLUCOSE: 104 mg/dL — AB (ref 65–99)
Potassium: 4 mmol/L (ref 3.5–5.1)
Sodium: 140 mmol/L (ref 135–145)
TOTAL PROTEIN: 5.5 g/dL — AB (ref 6.5–8.1)

## 2017-10-12 LAB — GLUCOSE, CAPILLARY: Glucose-Capillary: 99 mg/dL (ref 65–99)

## 2017-10-12 LAB — LIPASE, BLOOD: Lipase: 41 U/L (ref 11–51)

## 2017-10-12 MED ORDER — HALOPERIDOL LACTATE 5 MG/ML IJ SOLN: 2.5000 mg | Freq: Four times a day (QID) | INTRAMUSCULAR | Status: DC | PRN

## 2017-10-12 MED ORDER — POLYETHYLENE GLYCOL 3350 17 G PO PACK
17.0000 g | PACK | Freq: Every day | ORAL | Status: DC
  Administered 2017-10-12 – 2017-10-14 (×3): 17 g via ORAL
  Filled 2017-10-12 (×3): qty 1

## 2017-10-12 NOTE — NC FL2 (Signed)
[place] MEDICAID FL2 LEVEL OF CARE SCREENING TOOL     IDENTIFICATION  Patient Name: Melanie Hahn Birthdate: 1956/09/13 Sex: female Admission Date (Current Location): 10/09/2017  [facility] [place]  [place] ([id number] [facility]) Facility and Address:  [facility], [address], [place], [place] [address]      Provider Number: [id number]  Attending Physician Name and Address:  [name], Vivek J, MD  Relative Name and Phone Number:       Current Level of Care: Hospital Recommended Level of Care: Skilled Nursing Facility Prior Approval Number:    Date Approved/Denied:   PASRR Number:    Discharge Plan: SNF    Current Diagnoses: Patient Active Problem List   Diagnosis Date Noted  . Abdominal pain 10/09/2017  . Acute pancreatitis 10/09/2017  . Intractable nausea and vomiting 10/09/2017  . HIV antibody positive (HCC) 10/09/2017  . Schizophrenia (HCC) 07/15/2016    Orientation RESPIRATION BLADDER Height & Weight     Self  O2(Nasal Cannula 2L) Incontinent Weight: 210 lb 8 oz (95.5 kg) Height:  5\' 7"  (170.2 cm)  BEHAVIORAL SYMPTOMS/MOOD NEUROLOGICAL BOWEL NUTRITION STATUS      Continent Diet(Full Liquid)  AMBULATORY STATUS COMMUNICATION OF NEEDS Skin   Extensive Assist Verbally Normal                       Personal Care Assistance Level of Assistance  Bathing, Feeding, Dressing Bathing Assistance: Limited assistance Feeding assistance: Independent Dressing Assistance: Limited assistance Total Care Assistance: Limited assistance   Functional Limitations Info  Sight, Hearing, Speech Sight Info: Adequate Hearing Info: Adequate Speech Info: Adequate    SPECIAL CARE FACTORS FREQUENCY  PT (By licensed PT), OT (By licensed OT)     PT Frequency: (5) OT Frequency: (5)            Contractures Contractures Info: Not present    Additional Factors Info  Code Status, Allergies Code Status Info: (Full Code) Allergies Info:  (PENICILLINS )           Current Medications (10/12/2017):  This is the current hospital active medication list Current Facility-Administered Medications  Medication Dose Route Frequency Provider Last Rate Last Dose  . 0.9 %  sodium chloride infusion   Intravenous Continuous Marguarite ArbourSparks, Jeffrey D, MD 100 mL/hr at 10/12/17 0930    . acetaminophen (TYLENOL) tablet 650 mg  650 mg Oral Q6H PRN Marguarite ArbourSparks, Jeffrey D, MD       Or  . acetaminophen (TYLENOL) suppository 650 mg  650 mg Rectal Q6H PRN Marguarite ArbourSparks, Jeffrey D, MD      . benztropine (COGENTIN) tablet 1 mg  1 mg Oral BID Marguarite ArbourSparks, Jeffrey D, MD   1 mg at 10/12/17 0935  . bisacodyl (DULCOLAX) suppository 10 mg  10 mg Rectal Daily PRN Marguarite ArbourSparks, Jeffrey D, MD      . divalproex (DEPAKOTE ER) 24 hr tablet 1,000 mg  1,000 mg Oral BID Marguarite ArbourSparks, Jeffrey D, MD   1,000 mg at 10/12/17 0931  . heparin injection 5,000 Units  5,000 Units Subcutaneous Q8H Marguarite ArbourSparks, Jeffrey D, MD   5,000 Units at 10/12/17 1414  . hydrALAZINE (APRESOLINE) injection 10 mg  10 mg Intravenous Q4H PRN Marguarite ArbourSparks, Jeffrey D, MD      . HYDROmorphone (DILAUDID) injection 1 mg  1 mg Intravenous Q2H PRN Marguarite ArbourSparks, Jeffrey D, MD   1 mg at 10/10/17 0646  . iopamidol (ISOVUE-300) 61 % injection 30 mL  30 mL Oral Once PRN Gladstone PihSchaevitz, David  Molli Hazard, MD      . ipratropium-albuterol (DUONEB) 0.5-2.5 (3) MG/3ML nebulizer solution 3 mL  3 mL Nebulization Q6H PRN Marguarite Arbour, MD      . LORazepam (ATIVAN) injection 0.5 mg  0.5 mg Intravenous Q4H PRN Marguarite Arbour, MD   0.5 mg at 10/10/17 0220  . meropenem (MERREM) 1 g in sodium chloride 0.9 % 100 mL IVPB  1 g Intravenous Q12H Houston Siren, MD 200 mL/hr at 10/12/17 0930 1 g at 10/12/17 0930  . ondansetron (ZOFRAN) tablet 4 mg  4 mg Oral Q6H PRN Marguarite Arbour, MD       Or  . ondansetron (ZOFRAN) injection 4 mg  4 mg Intravenous Q6H PRN Marguarite Arbour, MD      . oxyCODONE-acetaminophen (PERCOCET/ROXICET) 5-325 MG per tablet 1 tablet  1 tablet Oral Q4H  PRN Houston Siren, MD   1 tablet at 10/11/17 1845  . pantoprazole (PROTONIX) injection 40 mg  40 mg Intravenous Q12H Marguarite Arbour, MD   40 mg at 10/12/17 0931  . polyethylene glycol (MIRALAX / GLYCOLAX) packet 17 g  17 g Oral Daily Houston Siren, MD   17 g at 10/12/17 1413  . QUEtiapine (SEROQUEL) tablet 200 mg  200 mg Oral Jacquenette Shone, MD   200 mg at 10/12/17 0936  . QUEtiapine (SEROQUEL) tablet 400 mg  400 mg Oral QHS Marguarite Arbour, MD   400 mg at 10/11/17 2224  . sodium chloride 0.9 % bolus 500 mL  500 mL Intravenous Once Myrna Blazer, MD      . traZODone (DESYREL) tablet 200 mg  200 mg Oral QHS Marguarite Arbour, MD   200 mg at 10/11/17 2232     Discharge Medications: Please see discharge summary for a list of discharge medications.  Relevant Imaging Results:  Relevant Lab Results:   Additional Information (SSN: [id number])  Payton Spark, Student-Social Work

## 2017-10-12 NOTE — Plan of Care (Signed)
Patient needs assistance with plan of care due to confusion.

## 2017-10-12 NOTE — Progress Notes (Signed)
Clinical Child psychotherapistocial Worker (CSW) contacted [facility] [phone] and spoke to administrator Elvia. Per Jamas LavElvia patient has mild confusion at baseline, walks independently without an assistive device and is on room air. Per Jamas LavElvia patient's guardian Maureen RalphsVivian is through an agency. Per Jamas LavElvia she will provide transport when patient is stable for D/C and her fax # is [phone]. CSW also gave Elvia an update on patient's status. CSW will continue to follow and assist as needed.   [employer] Hughes IncorporatedBailey Indria Bishara, LCSW [phone]

## 2017-10-12 NOTE — Progress Notes (Signed)
PT is recommending SNF. Clinical Child psychotherapistocial Worker (CSW) left patient's guardian Maureen RalphsVivian a Engineer, technical salesvoicemail.   [employer]Bailey Harmoni Lucus, LCSW [phone]

## 2017-10-12 NOTE — Progress Notes (Signed)
Pt up with PT and sat in chair today. Pt repts that she is now "passing gas". Pt received Miralax per MD order. Pt was asked several times today by this RN and the NT's to walk. Pt refused. Pt instructed the importance of ambulating to promote bowel sounds and release gas to decrease ileus. Will rept to oncoming RN.

## 2017-10-12 NOTE — Progress Notes (Signed)
[place] INFECTIOUS DISEASE PROGRESS NOTE Date of Admission:  10/09/2017     ID: Melanie Hahn is a 61 y.o. female with HIV, pancreatitis Principal Problem:   Acute pancreatitis Active Problems:   Abdominal pain   Intractable nausea and vomiting   HIV antibody positive ([place])  Subjective: Less pain. Is still NPO  ROS  Eleven systems are reviewed and negative except per hpi  Medications:  Antibiotics Given (last 72 hours)    Date/Time Action Medication Dose Rate   10/09/17 1802 New Bag/Given   vancomycin (VANCOCIN) IVPB 1000 mg/200 mL premix 1,000 mg 200 mL/hr   10/09/17 2021 Given   efavirenz-emtricitabine-tenofovir (ATRIPLA) 600-200-300 MG per tablet 1 tablet 1 tablet    10/09/17 2104 New Bag/Given   aztreonam (AZACTAM) 1 g in dextrose 5 % 50 mL IVPB 1 g 100 mL/hr   10/10/17 0447 New Bag/Given   aztreonam (AZACTAM) 1 g in dextrose 5 % 50 mL IVPB 1 g 100 mL/hr   10/10/17 0549 New Bag/Given   vancomycin (VANCOCIN) IVPB 1000 mg/200 mL premix 1,000 mg 200 mL/hr   10/10/17 0919 Given   efavirenz-emtricitabine-tenofovir (ATRIPLA) 600-200-300 MG per tablet 1 tablet 1 tablet    10/11/17 1205 New Bag/Given   levofloxacin (LEVAQUIN) IVPB 750 mg 750 mg 100 mL/hr   10/11/17 1809 New Bag/Given   meropenem (MERREM) 1 g in sodium chloride 0.9 % 100 mL IVPB 1 g 200 mL/hr   10/12/17 0930 New Bag/Given   meropenem (MERREM) 1 g in sodium chloride 0.9 % 100 mL IVPB 1 g 200 mL/hr     . benztropine  1 mg Oral BID  . divalproex  1,000 mg Oral BID  . heparin  5,000 Units Subcutaneous Q8H  . pantoprazole (PROTONIX) IV  40 mg Intravenous Q12H  . polyethylene glycol  17 g Oral Daily  . QUEtiapine  200 mg Oral BH-q7a  . QUEtiapine  400 mg Oral QHS  . traZODone  200 mg Oral QHS    Objective: Vital signs in last 24 hours: Temp:  [97.8 F (36.6 C)-98.2 F (36.8 C)] 97.8 F (36.6 C) (11/05 2002) Pulse Rate:  [117-119] 119 (11/05 2002) Resp:  [18-24] 18 (11/05 2002) BP:  (126-141)/(82-90) 141/82 (11/05 2002) SpO2:  [93 %-95 %] 93 % (11/05 2002) Weight:  [95.5 kg (210 lb 8 oz)] 95.5 kg (210 lb 8 oz) (11/06 0517) Constitutional:  oriented to person, place, and time.obese, sleepy HENT: [place]/AT, PERRLA, no scleral icterus Mouth/Throat: Oropharynx is clear and moist. No oropharyngeal exudate.  Cardiovascular: Normal rate, regular rhythm and normal heart sounds. Exam reveals no gallop and no friction rub.  No murmur heard.  Pulmonary/Chest: Effort normal and breath sounds normal. No respiratory distress.  has no wheezes.  Neck = supple, no nuchal rigidity Abdominal: obese, mod distention, mild ttp Lymphadenopathy: no cervical adenopathy. No axillary adenopathy Neurological: alert and oriented to person, place, and time.  Skin: Skin is warm and dry. No rash noted. No erythema.  Psychiatric: a normal mood and affect.  behavior is normal.     Lab Results Recent Labs    10/10/17 0419 10/11/17 0510 10/12/17 0532  WBC 20.7* 15.0*  --   HGB 15.1 13.5  --   HCT 46.3 40.3  --   NA 136 138 140  K 5.0 4.2 4.0  CL 108 111 117*  CO2 19* 21* 18*  BUN 34* 40* 30*  CREATININE 2.26* 2.30* 1.58*    Microbiology: Results for orders placed or performed  during the hospital encounter of 10/09/17  Blood Culture (routine x 2)     Status: None (Preliminary result)   Collection Time: 10/09/17 10:53 AM  Result Value Ref Range Status   Specimen Description BLOOD RIGHT ASSIST CONTROL  Final   Special Requests   Final    BOTTLES DRAWN AEROBIC AND ANAEROBIC Blood Culture results may not be optimal due to an inadequate volume of blood received in culture bottles   Culture NO GROWTH 3 DAYS  Final   Report Status PENDING  Incomplete  Urine culture     Status: None   Collection Time: 10/09/17 10:53 AM  Result Value Ref Range Status   Specimen Description URINE, RANDOM  Final   Special Requests NONE  Final   Culture   Final    NO GROWTH Performed at [facility],  [address], [place], [place] [address]    Report Status 10/11/2017 FINAL  Final  Blood Culture (routine x 2)     Status: None (Preliminary result)   Collection Time: 10/09/17 11:24 AM  Result Value Ref Range Status   Specimen Description BLOOD LEFT ASSIST CONTROL  Final   Special Requests   Final    BOTTLES DRAWN AEROBIC AND ANAEROBIC Blood Culture adequate volume   Culture NO GROWTH 3 DAYS  Final   Report Status PENDING  Incomplete    Studies/Results: Dg Abd 1 View  Result Date: 10/12/2017 CLINICAL DATA:  Abdominal distention, acute pancreatitis.  Ileus EXAM: ABDOMEN - 1 VIEW COMPARISON:  10/09/2017 new FINDINGS: Gas-filled loops of large and small bowel. Small bowel mildly distended to 3 cm. No gas the rectum. No air-fluid levels (supine exam). IMPRESSION: Gas-filled loops of large and small bowel with minimal distention consistent with ileus. Electronically Signed   By: Suzy Bouchard M.D.   On: 10/12/2017 11:44    Assessment/Plan: Melanie Hahn is a 61 y.o. female with well controlled HIV on atripla for several years, most recent  CD4  of 1597 and viral load of 42 in June 2018 now with severe pancreatitis. Unlikely related to the atripla given GI and surgery consults.   Recommendations Can hold atripla for now as she is NPO. Will discuss with her [place] and consider change to an integrase inhibitor combo.   She is HLA [id number] negative. Will follow.   Thank you very much for the consult. Will follow with you.  Leonel Ramsay   10/12/2017, 2:26 PM

## 2017-10-12 NOTE — Progress Notes (Signed)
[facility] [place] [facility]   PATIENT NAME: Melanie Hahn    MR#:  [id number]  DATE OF BIRTH:  Dec 26, 1955  SUBJECTIVE:   Patient here due to abdominal pain and nausea and noted to have CT scan findings suggestive of acute pancreatitis. Still has some abdominal distention but pain is improved. No nausea or vomiting. Lipase/LFTs have normalized. Tolerating a clear liquid diet.  REVIEW OF SYSTEMS:    Review of Systems  Constitutional: Negative for chills and fever.  HENT: Negative for congestion and tinnitus.   Eyes: Negative for blurred vision and double vision.  Respiratory: Negative for cough, shortness of breath and wheezing.   Cardiovascular: Negative for chest pain, orthopnea and PND.  Gastrointestinal: Negative for abdominal pain, diarrhea, nausea and vomiting.  Genitourinary: Negative for dysuria and hematuria.  Neurological: Negative for dizziness, sensory change and focal weakness.  All other systems reviewed and are negative.   Nutrition: Full Liquid diet Tolerating Diet: Yes Tolerating PT: Await Eval.   DRUG ALLERGIES:   Allergies  Allergen Reactions  . Penicillins Other (See Comments)    Has patient had a PCN reaction causing immediate rash, facial/tongue/throat swelling, SOB or lightheadedness with hypotension: unknown Has patient had a PCN reaction causing severe rash involving mucus membranes or skin necrosis: unknown Has patient had a PCN reaction that required hospitalization: unknown Has patient had a PCN reaction occurring within the last 10 years: unknown If all of the above answers are "NO", then may proceed with Cephalosporin use.     VITALS:  Blood pressure (!) 141/82, pulse (!) 119, temperature 97.8 F (36.6 C), resp. rate 18, height 5\' 7"  (1.702 m), weight 95.5 kg (210 lb 8 oz), SpO2 93 %.  PHYSICAL EXAMINATION:   Physical Exam  GENERAL:  61 y.o.-year-old patient lying in bed in no acute distress.  EYES: Pupils equal,  round, reactive to light and accommodation. No scleral icterus. Extraocular muscles intact.  HEENT: Head atraumatic, normocephalic. Oropharynx and nasopharynx clear.  NECK:  Supple, no jugular venous distention. No thyroid enlargement, no tenderness.  LUNGS: Normal breath sounds bilaterally, no wheezing, rales, rhonchi. No use of accessory muscles of respiration.  CARDIOVASCULAR: S1, S2 normal. No murmurs, rubs, or gallops.  ABDOMEN: Soft, NT, slightly distended. Bowel sounds Hypoactive. No organomegaly or mass.  EXTREMITIES: No cyanosis, clubbing or edema b/l.    NEUROLOGIC: Cranial nerves II through XII are intact. No focal Motor or sensory deficits b/l.  Globally weak.  PSYCHIATRIC: The patient is alert and oriented x 3.  SKIN: No obvious rash, lesion, or ulcer.    LABORATORY PANEL:   CBC Recent Labs  Lab 10/11/17 0510  WBC 15.0*  HGB 13.5  HCT 40.3  PLT 106*   ------------------------------------------------------------------------------------------------------------------  Chemistries  Recent Labs  Lab 10/12/17 0532  NA 140  K 4.0  CL 117*  CO2 18*  GLUCOSE 104*  BUN 30*  CREATININE 1.58*  CALCIUM 9.1  AST 29  ALT 15  ALKPHOS 56  BILITOT 0.7   ------------------------------------------------------------------------------------------------------------------  Cardiac Enzymes Recent Labs  Lab 10/09/17 1226  TROPONINI <0.03   ------------------------------------------------------------------------------------------------------------------  RADIOLOGY:  Dg Abd 1 View  Result Date: 10/12/2017 CLINICAL DATA:  Abdominal distention, acute pancreatitis.  Ileus EXAM: ABDOMEN - 1 VIEW COMPARISON:  10/09/2017 new FINDINGS: Gas-filled loops of large and small bowel. Small bowel mildly distended to 3 cm. No gas the rectum. No air-fluid levels (supine exam). IMPRESSION: Gas-filled loops of large and small bowel with minimal distention consistent  with ileus. Electronically  Signed   By: Melanie Hahn  Melanie Hahn Melanie Hahn.   On: 10/12/2017 11:44     ASSESSMENT AND PLAN:   61 year old female with past medical history of HIV, schizophrenia, hepatitis C who presents to the hospital due to abdominal pain, tachycardia and was noted to be in acute pancreatitis.  1. Acute pancreatitis- suspected to be secondary to microlithiasis as patient had sludge in the gallbladder. No evidence of alcohol abuse, LFTs ARE stable. Questionable related to HIV meds but unlikely.  -Appreciated gastroenterology and also surgical input. Continue supportive care with IV fluids, antiemetics, pain control. Lipase is trended down.  -Tolerating clear liquids and advance to full liquids today. Lipase/LFTs stable. - KUB showing Ileus this a.m. ?? Will give laxatives. No N/V.  Will cont. To monitor.  Ambulate and OOB in chair which may help.    2. Leukocytosis-stress mediated from the acute pancreatitis. - cont. Meropenem and WBC Count improving.    3. Acute kidney injury-secondary to the acute pancreatitis. Continue IV fluids and Cr. Is improving.  - renal dose meds and avoid Nephrotoxins.   4. History of HIV- off Atripla due to renal failure.  - appreciate ID input and last CD4 count was 1597 viral load was 42 in June 2018.  5. History of schizophrenia-continue Depakote, Seroquel, Cogentin.  Will get PT eval.    All the records are reviewed and case discussed with Care Management/Social Worker. Management plans discussed with the patient, family and they are in agreement.  CODE STATUS: Full code  DVT Prophylaxis: Heparin subcutaneous  TOTAL TIME TAKING CARE OF THIS PATIENT: 30 minutes.   POSSIBLE D/C IN 2-3 DAYS, DEPENDING ON CLINICAL CONDITION.   Melanie Hahn,Melanie Hahn Melanie Hahn on 10/12/2017 at 1:11 PM  Between 7am to 6pm - Pager - [phone]  After 6pm go to [email or web address] - Social research officer, governmentpassword [email or web address] [email or web address]  [facility] [place] [facility]  Office  [phone]  CC: Primary care physician; Melanie Hahn,  [name], MD

## 2017-10-12 NOTE — Progress Notes (Signed)
Rept to Dr. Cherlynn KaiserSainani. Pt has been restless throughout the day but was easily verbally redirected. At 1739, pt was yelling and RN went into room. Pt found to have her cardiac leads off, gown over her head, and tugging at her IV. Pt was agitated and demanding to get dressed to go home or go outside to smoke. Unable to redirect patient verbally. Ativan given per ordered. Pt resting comfortably in chair now. Pt easily aroused. All rept to Dr. Lenoria ChimeSainani-orders received to d/c tele, d/c Ativan, and give Haldol 2.5 mg IV every 6 hours for agitation. Will continue to monitor.

## 2017-10-12 NOTE — Evaluation (Signed)
Physical Therapy Evaluation Patient Details Name: Melanie RobertSheila Liou MRN: [id number] DOB: 12/19/1955 Today's Date: 10/12/2017   History of Present Illness  Pt is a 61 y.o. female presenting to hospital with several episodes of "seizure like activity" and 1-2 weeks h/o progressive abdominal pain with N/V/D.  Pt admitted to hospital with acute pancreatitis, leukocytosis, AKI, and small ileus.  PMH includes HIV, Hepatitis C, and schizophrenia.  Clinical Impression  Prior to hospital admission, pt was ambulating independently (no AD).  Pt lives at a group home and per chart was independent with ADL's.  Currently pt is mod assist supine to sit; mod assist to stand; and min to mod assist to ambulate a few feet bed to recliner with RW.  Pt demonstrates impaired balance, strength, and requires extra time to perform functional mobility with assist.  Pt would benefit from skilled PT to address noted impairments and functional limitations (see below for any additional details).  Upon hospital discharge, recommend pt discharge to STR.    Follow Up Recommendations SNF    Equipment Recommendations  Rolling walker with 5" wheels    Recommendations for Other Services       Precautions / Restrictions Precautions Precautions: Fall Restrictions Weight Bearing Restrictions: No      Mobility  Bed Mobility Overal bed mobility: Needs Assistance Bed Mobility: Supine to Sit     Supine to sit: Mod assist;HOB elevated     General bed mobility comments: assist for LE's and trunk supine to sit; vc's and extra time/effort required to perform and to scoot to edge of bed  Transfers Overall transfer level: Needs assistance Equipment used: None;Rolling walker (2 wheeled) Transfers: Sit to/from Stand Sit to Stand: Mod assist         General transfer comment: Pt initially refusing to use RW but required mod assist to stand and then grabbing onto therapist's arms to steady self; pt assisted back into sitting (mod  assist to control descent and prevent backwards loss of balance); pt eventually agreeable to use RW and then stood with mod assist.  Overall pt with posterior lean and requiring vc's to shift weight forward as well as UE and LE positioning for transfer technique.  Ambulation/Gait Ambulation/Gait assistance: Min assist;Mod assist Ambulation Distance (Feet): 3 Feet(bed to recliner) Assistive device: Rolling walker (2 wheeled)   Gait velocity: decreased   General Gait Details: decreased B step length/foot clearance/heelstrike; unsteady; vc's for correct use of RW  Stairs            Wheelchair Mobility    Modified Rankin (Stroke Patients Only)       Balance Overall balance assessment: Needs assistance Sitting-balance support: Bilateral upper extremity supported;Feet supported Sitting balance-Leahy Scale: Poor Sitting balance - Comments: intermittent posterior lean requiring min to mod assist to correct   Standing balance support: Bilateral upper extremity supported Standing balance-Leahy Scale: Poor Standing balance comment: pt with posterior lean standing with RW requiring min to mod assist to shift weight forward                             Pertinent Vitals/Pain Pain Assessment: Faces Faces Pain Scale: Hurts little more Pain Location: abdomen and low back Pain Descriptors / Indicators: Sore;Aching Pain Intervention(s): Limited activity within patient's tolerance;Monitored during session;Repositioned  HR 108-118 bpm and O2 92% or greater on room air during session.    Home Living Family/patient expects to be discharged to:: Group home  Additional Comments: Pt reports 4 steps with R railing to enter home.    Prior Function Level of Independence: Independent         Comments: Per chart and pt, pt independent with ADL's and walks without AD.  Per chart, pt incontinent (and dislikes pull-ups).  No O2 baseline.  Pt reports 2 falls in  past 6 months d/t slipping.  Chart also reports pt with mild confusion (baseline).     Hand Dominance        Extremity/Trunk Assessment   Upper Extremity Assessment Upper Extremity Assessment: Generalized weakness    Lower Extremity Assessment Lower Extremity Assessment: Generalized weakness       Communication   Communication: No difficulties  Cognition Arousal/Alertness: Awake/alert Behavior During Therapy: Impulsive Overall Cognitive Status: (Oriented to person and place.)                                        General Comments   Nursing cleared pt for participation in physical therapy.  Pt agreeable to PT session.    Exercises  Transfer and gait training with RW.   Assessment/Plan    PT Assessment Patient needs continued PT services  PT Problem List Decreased strength;Decreased activity tolerance;Decreased balance;Decreased mobility;Decreased cognition;Decreased knowledge of use of DME;Decreased knowledge of precautions;Decreased safety awareness;Pain       PT Treatment Interventions DME instruction;Gait training;Stair training;Functional mobility training;Therapeutic activities;Therapeutic exercise;Balance training;Patient/family education    PT Goals (Current goals can be found in the Care Plan section)  Acute Rehab PT Goals Patient Stated Goal: to have less pain PT Goal Formulation: With patient Time For Goal Achievement: 10/26/17 Potential to Achieve Goals: Fair    Frequency Min 2X/week   Barriers to discharge Decreased caregiver support      Co-evaluation               AM-PAC PT "6 Clicks" Daily Activity  Outcome Measure Difficulty turning over in bed (including adjusting bedclothes, sheets and blankets)?: Unable Difficulty moving from lying on back to sitting on the side of the bed? : Unable Difficulty sitting down on and standing up from a chair with arms (e.g., wheelchair, bedside commode, etc,.)?: Unable Help needed  moving to and from a bed to chair (including a wheelchair)?: A Lot Help needed walking in hospital room?: A Lot Help needed climbing 3-5 steps with a railing? : Total 6 Click Score: 8    End of Session Equipment Utilized During Treatment: Gait belt Activity Tolerance: Patient tolerated treatment well Patient left: in chair;with call bell/phone within reach;with chair alarm set Nurse Communication: Mobility status;Precautions PT Visit Diagnosis: Other abnormalities of gait and mobility (R26.89);Muscle weakness (generalized) (M62.81);History of falling (Z91.81)    Time: [id number] PT Time Calculation (min) (ACUTE ONLY): 33 min   Charges:   PT Evaluation $PT Eval Low Complexity: 1 Low PT Treatments $Therapeutic Activity: 8-22 mins   PT G Codes:   PT G-Codes **NOT FOR INPATIENT CLASS** Functional Assessment Tool Used: AM-PAC 6 Clicks Basic Mobility Functional Limitation: Mobility: Walking and moving around Mobility: Walking and Moving Around Current Status (H0865(G8978): At least 80 percent but less than 100 percent impaired, limited or restricted Mobility: Walking and Moving Around Goal Status [id number](G8979): At least 1 percent but less than 20 percent impaired, limited or restricted    Hendricks Limesmily Kristeena Meineke, PT 10/12/17, 4:37 PM [phone]

## 2017-10-13 ENCOUNTER — Inpatient Hospital Stay: Payer: Medicare Other

## 2017-10-13 LAB — CBC
HEMATOCRIT: 37.7 % (ref 35.0–47.0)
Hemoglobin: 12.5 g/dL (ref 12.0–16.0)
MCH: 32.6 pg (ref 26.0–34.0)
MCHC: 33.1 g/dL (ref 32.0–36.0)
MCV: 98.6 fL (ref 80.0–100.0)
PLATELETS: 127 10*3/uL — AB (ref 150–440)
RBC: 3.82 MIL/uL (ref 3.80–5.20)
RDW: 14.8 % — AB (ref 11.5–14.5)
WBC: 10.6 10*3/uL (ref 3.6–11.0)

## 2017-10-13 LAB — BASIC METABOLIC PANEL
Anion gap: 4 — ABNORMAL LOW (ref 5–15)
BUN: 25 mg/dL — AB (ref 6–20)
CO2: 19 mmol/L — AB (ref 22–32)
Calcium: 9.8 mg/dL (ref 8.9–10.3)
Chloride: 119 mmol/L — ABNORMAL HIGH (ref 101–111)
Creatinine, Ser: 1.19 mg/dL — ABNORMAL HIGH (ref 0.44–1.00)
GFR calc Af Amer: 56 mL/min — ABNORMAL LOW (ref >60)
GFR, EST NON AFRICAN AMERICAN: 48 mL/min — AB (ref >60)
Glucose, Bld: 91 mg/dL (ref 65–99)
POTASSIUM: 3.7 mmol/L (ref 3.5–5.1)
Sodium: 142 mmol/L (ref 135–145)

## 2017-10-13 MED ORDER — VITAMIN D 1000 UNITS PO TABS
1000.0000 [IU] | ORAL_TABLET | Freq: Every day | ORAL | Status: DC
  Administered 2017-10-13 – 2017-10-14 (×2): 1000 [IU] via ORAL
  Filled 2017-10-13 (×2): qty 1

## 2017-10-13 MED ORDER — MIRABEGRON ER 25 MG PO TB24
25.0000 mg | ORAL_TABLET | Freq: Every day | ORAL | Status: DC
  Administered 2017-10-14: 25 mg via ORAL
  Filled 2017-10-13: qty 1

## 2017-10-13 NOTE — Progress Notes (Signed)
[facility] [place] [facility]   PATIENT NAME: Melanie Hahn    MR#:  [id number]  DATE OF BIRTH:  07/31/1956  SUBJECTIVE:   Patient here due to abdominal pain and nausea and noted to have CT scan findings suggestive of acute pancreatitis. Still has some abdominal distention but pain is improved. No nausea or vomiting. Lipase/LFTs have normalized. Tolerated full liquid diet. Patient is drowsy but arousable and confused. REVIEW OF SYSTEMS:    Review of Systems  Constitutional: Negative for chills and fever.  HENT: Negative for congestion and tinnitus.   Eyes: Negative for blurred vision and double vision.  Respiratory: Negative for cough, shortness of breath and wheezing.   Cardiovascular: Negative for chest pain, orthopnea and PND.  Gastrointestinal: Negative for abdominal pain, diarrhea, nausea and vomiting.  Genitourinary: Negative for dysuria and hematuria.  Neurological: Negative for dizziness, sensory change and focal weakness.  All other systems reviewed and are negative.   Nutrition: Full Liquid diet Tolerating Diet: Yes Tolerating PT: Await Eval.   DRUG ALLERGIES:   Allergies  Allergen Reactions  . Penicillins Other (See Comments)    Has patient had a PCN reaction causing immediate rash, facial/tongue/throat swelling, SOB or lightheadedness with hypotension: unknown Has patient had a PCN reaction causing severe rash involving mucus membranes or skin necrosis: unknown Has patient had a PCN reaction that required hospitalization: unknown Has patient had a PCN reaction occurring within the last 10 years: unknown If all of the above answers are "NO", then may proceed with Cephalosporin use.     VITALS:  Blood pressure 129/86, pulse (!) 101, temperature (!) 97.2 F (36.2 C), temperature source Axillary, resp. rate (!) 22, height 5\' 7"  (1.702 m), weight 211 lb 3.2 oz (95.8 kg), SpO2 95 %.  PHYSICAL EXAMINATION:   Physical Exam  GENERAL:  61  y.o.-year-old patient lying in bed in no acute distress.  EYES: Pupils equal, round, reactive to light and accommodation. No scleral icterus. Extraocular muscles intact.  HEENT: Head atraumatic, normocephalic. Oropharynx and nasopharynx clear.  NECK:  Supple, no jugular venous distention. No thyroid enlargement, no tenderness.  LUNGS: Normal breath sounds bilaterally, no wheezing, rales, rhonchi. No use of accessory muscles of respiration.  CARDIOVASCULAR: S1, S2 normal. No murmurs, rubs, or gallops.  ABDOMEN: Soft, NT, slightly distended. Bowel sounds Hypoactive. No organomegaly or mass.  EXTREMITIES: No cyanosis, clubbing or edema b/l.    NEUROLOGIC: Cranial nerves II through XII are intact. No focal Motor or sensory deficits b/l.  Globally weak.  PSYCHIATRIC: The patient is drowsy and confused.  SKIN: No obvious rash, lesion, or ulcer.    LABORATORY PANEL:   CBC Recent Labs  Lab 10/13/17 0946  WBC 10.6  HGB 12.5  HCT 37.7  PLT 127*   ------------------------------------------------------------------------------------------------------------------  Chemistries  Recent Labs  Lab 10/12/17 0532 10/13/17 0946  NA 140 142  K 4.0 3.7  CL 117* 119*  CO2 18* 19*  GLUCOSE 104* 91  BUN 30* 25*  CREATININE 1.58* 1.19*  CALCIUM 9.1 9.8  AST 29  --   ALT 15  --   ALKPHOS 56  --   BILITOT 0.7  --    ------------------------------------------------------------------------------------------------------------------  Cardiac Enzymes Recent Labs  Lab 10/09/17 1226  TROPONINI <0.03   ------------------------------------------------------------------------------------------------------------------  RADIOLOGY:  Dg Abd 1 View  Result Date: 10/13/2017 CLINICAL DATA:  Ileus EXAM: ABDOMEN - 1 VIEW COMPARISON:  10/12/2017 FINDINGS: Interval improvement in gaseous distention of bowel. Nondilated large small bowel loops  are now present. No bowel thickening. No urinary tract calculi.  IMPRESSION: Interval improvement in distended bowel loops since the prior study. Bowel gas pattern now is normal. Electronically Signed   By: Marlan Palauharles  Clark M.D.   On: 10/13/2017 09:07   Dg Abd 1 View  Result Date: 10/12/2017 CLINICAL DATA:  Abdominal distention, acute pancreatitis.  Ileus EXAM: ABDOMEN - 1 VIEW COMPARISON:  10/09/2017 new FINDINGS: Gas-filled loops of large and small bowel. Small bowel mildly distended to 3 cm. No gas the rectum. No air-fluid levels (supine exam). IMPRESSION: Gas-filled loops of large and small bowel with minimal distention consistent with ileus. Electronically Signed   By: Genevive BiStewart  Edmunds M.D.   On: 10/12/2017 11:44     ASSESSMENT AND PLAN:   61 year old female with past medical history of HIV, schizophrenia, hepatitis C who presents to the hospital due to abdominal pain, tachycardia and was noted to be in acute pancreatitis.  1. Acute pancreatitis- suspected to be secondary to microlithiasis as patient had sludge in the gallbladder. No evidence of alcohol abuse, LFTs ARE stable. Questionable related to HIV meds but unlikely.  -Appreciated gastroenterology and also surgical input. Continue supportive care with IV fluids, antiemetics, pain control. Lipase is trended down to normal range. -Tolerated liquids and advance to soft diet today. Lipase/LFTs stable. - KUB showing Ileus, given laxatives. No N/V.   Ambulate and OOB in chair which may help.   Repeat xray: Interval improvement. Per Dr. Excell Seltzerooper, Would not recommend any sort of surgical intervention until the pancreatitis is improved considerably.   2. Leukocytosis-stress mediated from the acute pancreatitis. - cont. Meropenem and WBC Count improved.   3. Acute kidney injury-secondary to the acute pancreatitis.  Improved with IV fluids.  4. History of HIV- off Atripla due to renal failure.  - appreciate ID input and last CD4 count was 1597 viral load was 42 in June 2018.  5. History of  schizophrenia-continue Depakote, Seroquel, Cogentin.  PT evaluation suggest SNF.  All the records are reviewed and case discussed with Care Management/Social Worker. Management plans discussed with the patient, family and they are in agreement.  CODE STATUS: Full code  DVT Prophylaxis: Heparin subcutaneous  TOTAL TIME TAKING CARE OF THIS PATIENT: 30 minutes.   POSSIBLE D/C IN 2 DAYS, DEPENDING ON CLINICAL CONDITION.   Shaune Pollackhen, Dwanda Tufano M.D on 10/13/2017 at 2:08 PM  Between 7am to 6pm - Pager - [phone]  After 6pm go to [email or web address] - Social research officer, governmentpassword EPAS [facility]  [facility] [place] [facility]  Office  [phone]  CC: Primary care physician; [name], [name], MD

## 2017-10-13 NOTE — Plan of Care (Signed)
Patient was up to chair and transferred to bed, no complaints of pain. Patient is drowsy but arousable and confused.

## 2017-10-13 NOTE — Progress Notes (Signed)
PASARR has been received, [id number] E expires on 11/12/2017. Clinical Child psychotherapistocial Worker (CSW) received a call from patient's guardian Maureen RalphsVivian. Per Maureen RalphsVivian [place] [employer] contracts with the state. Guardian is agreeable to SNF. CSW presented bed offers to guardian and she chose [facility]. [facility] liaison is aware of accepted bed offer. Per Jomarie LongsJoseph he will contact the guardian and the group home about getting patient's HIV med Atripla from home. CSW will continue to follow and assist as needed.   Baker Hughes IncorporatedBailey Annebelle Bostic, LCSW [phone]

## 2017-10-13 NOTE — Progress Notes (Signed)
CC: Pancreatitis Subjective: This patient with pancreatitis possibly and likely of biliary origin.  She currently has no abdominal pain and no nausea or vomiting  Objective: Vital signs in last 24 hours: Temp:  [97.2 F (36.2 C)-98.5 F (36.9 C)] 97.2 F (36.2 C) (11/07 0838) Pulse Rate:  [101-115] 101 (11/07 0838) Resp:  [18-22] 22 (11/07 0838) BP: (112-139)/(76-90) 129/86 (11/07 0838) SpO2:  [95 %-100 %] 95 % (11/07 0838) Weight:  [211 lb 3.2 oz (95.8 kg)] 211 lb 3.2 oz (95.8 kg) (11/07 0500) Last BM Date: 10/08/17  Intake/Output from previous day: 11/06 0701 - 11/07 0700 In: 2940 [P.O.:840; I.V.:2100] Out: 250 [Urine:250] Intake/Output this shift: No intake/output data recorded.  Physical exam:  Abdomen is distended and tympanitic but minimally tender in the left upper quadrant without peritoneal signs calves are nontender no icterus no jaundice vital signs reviewed  Lab Results: CBC  Recent Labs    10/11/17 0510 10/13/17 0946  WBC 15.0* 10.6  HGB 13.5 12.5  HCT 40.3 37.7  PLT 106* 127*   BMET Recent Labs    10/12/17 0532 10/13/17 0946  NA 140 142  K 4.0 3.7  CL 117* 119*  CO2 18* 19*  GLUCOSE 104* 91  BUN 30* 25*  CREATININE 1.58* 1.19*  CALCIUM 9.1 9.8   PT/INR No results for input(s): LABPROT, INR in the last 72 hours. ABG No results for input(s): PHART, HCO3 in the last 72 hours.  Invalid input(s): PCO2, PO2  Studies/Results: Dg Abd 1 View  Result Date: 10/13/2017 CLINICAL DATA:  Ileus EXAM: ABDOMEN - 1 VIEW COMPARISON:  10/12/2017 FINDINGS: Interval improvement in gaseous distention of bowel. Nondilated large small bowel loops are now present. No bowel thickening. No urinary tract calculi. IMPRESSION: Interval improvement in distended bowel loops since the prior study. Bowel gas pattern now is normal. Electronically Signed   By: Marlan Palauharles  Clark M.D.   On: 10/13/2017 09:07   Dg Abd 1 View  Result Date: 10/12/2017 CLINICAL DATA:  Abdominal  distention, acute pancreatitis.  Ileus EXAM: ABDOMEN - 1 VIEW COMPARISON:  10/09/2017 new FINDINGS: Gas-filled loops of large and small bowel. Small bowel mildly distended to 3 cm. No gas the rectum. No air-fluid levels (supine exam). IMPRESSION: Gas-filled loops of large and small bowel with minimal distention consistent with ileus. Electronically Signed   By: Genevive BiStewart  Edmunds M.D.   On: 10/12/2017 11:44    Anti-infectives: Anti-infectives (From admission, onward)   Start     Dose/Rate Route Frequency Ordered Stop   10/11/17 1800  meropenem (MERREM) 1 g in sodium chloride 0.9 % 100 mL IVPB     1 g 200 mL/hr over 30 Minutes Intravenous Every 12 hours 10/11/17 1402     10/11/17 1200  levofloxacin (LEVAQUIN) IVPB 750 mg  Status:  Discontinued     750 mg 100 mL/hr over 90 Minutes Intravenous Every 48 hours 10/09/17 1352 10/11/17 1402   10/11/17 0549  vancomycin (VANCOCIN) IVPB 1000 mg/200 mL premix  Status:  Discontinued     1,000 mg 200 mL/hr over 60 Minutes Intravenous Every 24 hours 10/10/17 1012 10/10/17 1157   10/09/17 2000  aztreonam (AZACTAM) 1 g in dextrose 5 % 50 mL IVPB  Status:  Discontinued     1 g 100 mL/hr over 30 Minutes Intravenous Every 8 hours 10/09/17 1353 10/10/17 1157   10/09/17 1845  efavirenz-emtricitabine-tenofovir (ATRIPLA) 600-200-300 MG per tablet 1 tablet  Status:  Discontinued     1 tablet Oral Daily 10/09/17  1702 10/10/17 1130   10/09/17 1800  vancomycin (VANCOCIN) IVPB 1000 mg/200 mL premix  Status:  Discontinued     1,000 mg 200 mL/hr over 60 Minutes Intravenous Every 12 hours 10/09/17 1355 10/10/17 1012   10/09/17 1145  levofloxacin (LEVAQUIN) IVPB 750 mg     750 mg 100 mL/hr over 90 Minutes Intravenous  Once 10/09/17 1140 10/09/17 1414   10/09/17 1145  aztreonam (AZACTAM) 2 g in dextrose 5 % 50 mL IVPB     2 g 100 mL/hr over 30 Minutes Intravenous  Once 10/09/17 1140 10/09/17 1233   10/09/17 1145  vancomycin (VANCOCIN) IVPB 1000 mg/200 mL premix     1,000  mg 200 mL/hr over 60 Minutes Intravenous  Once 10/09/17 1140 10/09/17 1414      Assessment/Plan:  Bicarb remains depressed white blood cell count is now normal patient is tachycardic.  Severe pancreatitis with likely origin his gallbladder.  Would not recommend any sort of surgical intervention until the pancreatitis is improved considerably.  Cholecystectomy would be only with the goal to prevent this from happening again.  We will be available as needed.  Lattie Hawichard E Ravinder Hofland, MD, FACS  10/13/2017

## 2017-10-13 NOTE — Progress Notes (Signed)
[facility] INFECTIOUS DISEASE PROGRESS NOTE Date of Admission:  10/09/2017     ID: Melanie Hahn is a 61 y.o. female with HIV, pancreatitis Principal Problem:   Acute pancreatitis Active Problems:   Abdominal pain   Intractable nausea and vomiting   HIV antibody positive ([employer])  Subjective: Less pain. No fevers  ROS  Eleven systems are reviewed and negative except per hpi  Medications:  Antibiotics Given (last 72 hours)    Date/Time Action Medication Dose Rate   10/11/17 1205 New Bag/Given   levofloxacin (LEVAQUIN) IVPB 750 mg 750 mg 100 mL/hr   10/11/17 1809 New Bag/Given   meropenem (MERREM) 1 g in sodium chloride 0.9 % 100 mL IVPB 1 g 200 mL/hr   10/12/17 0930 New Bag/Given   meropenem (MERREM) 1 g in sodium chloride 0.9 % 100 mL IVPB 1 g 200 mL/hr   10/12/17 2129 New Bag/Given   meropenem (MERREM) 1 g in sodium chloride 0.9 % 100 mL IVPB 1 g 200 mL/hr   10/13/17 0958 New Bag/Given   meropenem (MERREM) 1 g in sodium chloride 0.9 % 100 mL IVPB 1 g 200 mL/hr     . benztropine  1 mg Oral BID  . cholecalciferol  1,000 Units Oral Daily  . divalproex  1,000 mg Oral BID  . heparin  5,000 Units Subcutaneous Q8H  . [START ON 10/14/2017] mirabegron ER  25 mg Oral Daily  . pantoprazole (PROTONIX) IV  40 mg Intravenous Q12H  . polyethylene glycol  17 g Oral Daily  . QUEtiapine  200 mg Oral BH-q7a  . QUEtiapine  400 mg Oral QHS  . traZODone  200 mg Oral QHS    Objective: Vital signs in last 24 hours: Temp:  [97.2 F (36.2 C)-98.5 F (36.9 C)] 97.2 F (36.2 C) (11/07 0838) Pulse Rate:  [101-115] 101 (11/07 0838) Resp:  [18-22] 22 (11/07 0838) BP: (112-139)/(76-90) 129/86 (11/07 0838) SpO2:  [95 %-100 %] 95 % (11/07 0838) Weight:  [95.8 kg (211 lb 3.2 oz)] 95.8 kg (211 lb 3.2 oz) (11/07 0500) Constitutional:  oriented to person, place, and time.obese, sleepy HENT: [place]/AT, PERRLA, no scleral icterus Mouth/Throat: Oropharynx is clear and moist. No oropharyngeal exudate.   Cardiovascular: Normal rate, regular rhythm and normal heart sounds. Exam reveals no gallop and no friction rub.  No murmur heard.  Pulmonary/Chest: Effort normal and breath sounds normal. No respiratory distress.  has no wheezes.  Neck = supple, no nuchal rigidity Abdominal: obese, mod distention, mild ttp Lymphadenopathy: no cervical adenopathy. No axillary adenopathy Neurological: alert and oriented to person, place, and time.  Skin: Skin is warm and dry. No rash noted. No erythema.  Psychiatric: a normal mood and affect.  behavior is normal.     Lab Results Recent Labs    10/11/17 0510 10/12/17 0532 10/13/17 0946  WBC 15.0*  --  10.6  HGB 13.5  --  12.5  HCT 40.3  --  37.7  NA 138 140 142  K 4.2 4.0 3.7  CL 111 117* 119*  CO2 21* 18* 19*  BUN 40* 30* 25*  CREATININE 2.30* 1.58* 1.19*    Microbiology: Results for orders placed or performed during the hospital encounter of 10/09/17  Blood Culture (routine x 2)     Status: None (Preliminary result)   Collection Time: 10/09/17 10:53 AM  Result Value Ref Range Status   Specimen Description BLOOD RIGHT ASSIST CONTROL  Final   Special Requests   Final  BOTTLES DRAWN AEROBIC AND ANAEROBIC Blood Culture results may not be optimal due to an inadequate volume of blood received in culture bottles   Culture NO GROWTH 4 DAYS  Final   Report Status PENDING  Incomplete  Urine culture     Status: None   Collection Time: 10/09/17 10:53 AM  Result Value Ref Range Status   Specimen Description URINE, RANDOM  Final   Special Requests NONE  Final   Culture   Final    NO GROWTH Performed at [facility], [address], [place], [place] [address]    Report Status 10/11/2017 FINAL  Final  Blood Culture (routine x 2)     Status: None (Preliminary result)   Collection Time: 10/09/17 11:24 AM  Result Value Ref Range Status   Specimen Description BLOOD LEFT ASSIST CONTROL  Final   Special Requests   Final    BOTTLES DRAWN  AEROBIC AND ANAEROBIC Blood Culture adequate volume   Culture NO GROWTH 4 DAYS  Final   Report Status PENDING  Incomplete    Studies/Results: Dg Abd 1 View  Result Date: 10/13/2017 CLINICAL DATA:  Ileus EXAM: ABDOMEN - 1 VIEW COMPARISON:  10/12/2017 FINDINGS: Interval improvement in gaseous distention of bowel. Nondilated large small bowel loops are now present. No bowel thickening. No urinary tract calculi. IMPRESSION: Interval improvement in distended bowel loops since the prior study. Bowel gas pattern now is normal. Electronically Signed   By: Franchot Gallo M.D.   On: 10/13/2017 09:07   Dg Abd 1 View  Result Date: 10/12/2017 CLINICAL DATA:  Abdominal distention, acute pancreatitis.  Ileus EXAM: ABDOMEN - 1 VIEW COMPARISON:  10/09/2017 new FINDINGS: Gas-filled loops of large and small bowel. Small bowel mildly distended to 3 cm. No gas the rectum. No air-fluid levels (supine exam). IMPRESSION: Gas-filled loops of large and small bowel with minimal distention consistent with ileus. Electronically Signed   By: Suzy Bouchard M.D.   On: 10/12/2017 11:44    Assessment/Plan: Melanie Hahn is a 61 y.o. female with well controlled HIV on atripla for several years, most recent  CD4  of 1597 and viral load of 42 in June 2018 now with severe pancreatitis. Unlikely related to the atripla given GI and surgery consults.    Recommendations Can hold atripla for now as she is NPO. Will discuss with her [place] and consider change to an integrase inhibitor combo.   She is HLA [id number] negative. Will follow.   Thank you very much for the consult. Will follow with you.  Leonel Ramsay   10/13/2017, 2:45 PM

## 2017-10-14 LAB — CULTURE, BLOOD (ROUTINE X 2)
CULTURE: NO GROWTH
Culture: NO GROWTH
SPECIAL REQUESTS: ADEQUATE

## 2017-10-14 LAB — BASIC METABOLIC PANEL
ANION GAP: 5 (ref 5–15)
BUN: 25 mg/dL — ABNORMAL HIGH (ref 6–20)
CHLORIDE: 119 mmol/L — AB (ref 101–111)
CO2: 21 mmol/L — AB (ref 22–32)
Calcium: 10.4 mg/dL — ABNORMAL HIGH (ref 8.9–10.3)
Creatinine, Ser: 1.12 mg/dL — ABNORMAL HIGH (ref 0.44–1.00)
GFR calc non Af Amer: 52 mL/min — ABNORMAL LOW (ref >60)
Glucose, Bld: 108 mg/dL — ABNORMAL HIGH (ref 65–99)
POTASSIUM: 3.7 mmol/L (ref 3.5–5.1)
Sodium: 145 mmol/L (ref 135–145)

## 2017-10-14 LAB — GLUCOSE, CAPILLARY: GLUCOSE-CAPILLARY: 96 mg/dL (ref 65–99)

## 2017-10-14 NOTE — Progress Notes (Signed)
Patient left the unit via EMS transport with belongings. Patient being transported to [facility].

## 2017-10-14 NOTE — Clinical Social Work Placement (Signed)
   CLINICAL SOCIAL WORK PLACEMENT  NOTE  Date:  10/14/2017  Patient Details  Name: Melanie RobertSheila Amsler MRN: [id number] Date of Birth: 01/07/1956  Clinical Social Work is seeking post-discharge placement for this patient at the Skilled  Nursing Facility level of care (*CSW will initial, date and re-position this form in  chart as items are completed):  Yes   Patient/family provided with [facility] Clinical Social Work Department's list of facilities offering this level of care within the geographic area requested by the patient (or if unable, by the patient's family).  Yes   Patient/family informed of their freedom to choose among providers that offer the needed level of care, that participate in Medicare, Medicaid or managed care program needed by the patient, have an available bed and are willing to accept the patient.  Yes   Patient/family informed of [facility]'s ownership interest in [facility] and [facility], as well as of the fact that they are under no obligation to receive care at these facilities.  PASRR submitted to EDS on 10/12/17     PASRR number received on 10/13/17     Existing PASRR number confirmed on       FL2 transmitted to all facilities in geographic area requested by pt/family on 10/12/17     FL2 transmitted to all facilities within larger geographic area on       Patient informed that his/her managed care company has contracts with or will negotiate with certain facilities, including the following:        Yes   Patient/family informed of bed offers received.  Patient chooses bed at ([facility] )     Physician recommends and patient chooses bed at      Patient to be transferred to ([facility] ) on 10/14/17.  Patient to be transferred to facility by [facility] )     Patient family notified on 10/14/17 of transfer.  Name of family member notified:  (Patient's legal guardian Maureen RalphsVivian is aware of D/C today. )     PHYSICIAN       Additional Comment:     _______________________________________________ Annie Saephan, Darleen CrockerBailey M, LCSW 10/14/2017, 11:10 AM

## 2017-10-14 NOTE — Progress Notes (Signed)
Patient is medically stable for D/C to [facility] today. Per Jomarie LongsJoseph [facility] liaison patient can come today to room 504. RN will call report to RN Elly ModenaKathy Rogers at [phone] and arrange EMS for transport. Clinical Child psychotherapistocial Worker (CSW) sent D/C orders to [facility] via [facility]. Patient is aware of above. CSW contacted patient's guardian Maureen RalphsVivian and made her aware of above. Please reconsult if future social work needs arise. CSW signing off.   Baker Hughes IncorporatedBailey Berlyn Malina, LCSW [phone]

## 2017-10-14 NOTE — Progress Notes (Signed)
Report called to Elly ModenaKathy Rogers, LPN at [facility]. Patient going to room 504. EMS called for transport.

## 2017-10-14 NOTE — Discharge Summary (Signed)
[facility] [place] [facility]   PATIENT NAME: Melanie Hahn    MR#:  [id number]  DATE OF BIRTH:  11-May-1956  DATE OF ADMISSION:  10/09/2017   ADMITTING PHYSICIAN: Marguarite Arbour, MD  DATE OF DISCHARGE: 10/14/2017  PRIMARY CARE PHYSICIAN: Nira Conn, MD   ADMISSION DIAGNOSIS:  SIRS (systemic inflammatory response syndrome) (HCC) [R65.10] Bilious vomiting with nausea [R11.14] Acute pancreatitis, unspecified complication status, unspecified pancreatitis type [K85.90] DISCHARGE DIAGNOSIS:  Principal Problem:   Acute pancreatitis Active Problems:   Abdominal pain   Intractable nausea and vomiting   HIV antibody positive (HCC)  SECONDARY DIAGNOSIS:   Past Medical History:  Diagnosis Date  . Hepatitis C   . HIV (human immunodeficiency virus infection) (HCC)   . Obesity   . Schizophrenia [facility])    HOSPITAL COURSE:   61 year old female with past medical history of HIV, schizophrenia, hepatitis C who presents to the hospital due to abdominal pain, tachycardia and was noted to be in acute pancreatitis.  1. Acute pancreatitis- suspected to be secondary to microlithiasis as patient had sludge in the gallbladder. No evidence of alcohol abuse, LFTs ARE stable. Questionable related to HIV meds but unlikely.  She has been treated with IV fluids, antiemetics, pain control. Lipase is trended down to normal range. -Tolerated soft diet. Lipase/LFTs stable. - KUB showing Ileus, given laxatives. No N/V.   Ambulate and OOB in chair which may help.   Repeat xray: Interval improvement. Per Dr. Excell Seltzer, Would not recommend any sort of surgical intervention until the pancreatitis is improved considerably.   2. Leukocytosis-stress mediated from the acute pancreatitis. She was on Meropenem and WBC Count improved.   3. Acute kidney injury-secondary to the acute pancreatitis.  Improved with IV fluids.  4. History of HIV- off Atripla due to renal failure.  -  appreciate ID input and last CD4 count was 1597 viral load was 42 in June 2018. Per Dr. Glena Norfolk, hold atripla for now and will discuss with her [facility] physician and consider change to an integrase inhibitor combo.    5. History of schizophrenia-continue Depakote, Seroquel, Cogentin.  PT evaluation suggest SNF.  DISCHARGE CONDITIONS:  Stable, discharge to SNF today. CONSULTS OBTAINED:  Treatment Team:  Mick Sell, MD Ricarda Frame, MD DRUG ALLERGIES:   Allergies  Allergen Reactions  . Penicillins Other (See Comments)    Has patient had a PCN reaction causing immediate rash, facial/tongue/throat swelling, SOB or lightheadedness with hypotension: unknown Has patient had a PCN reaction causing severe rash involving mucus membranes or skin necrosis: unknown Has patient had a PCN reaction that required hospitalization: unknown Has patient had a PCN reaction occurring within the last 10 years: unknown If all of the above answers are "NO", then may proceed with Cephalosporin use.    DISCHARGE MEDICATIONS:   Allergies as of 10/14/2017      Reactions   Penicillins Other (See Comments)   Has patient had a PCN reaction causing immediate rash, facial/tongue/throat swelling, SOB or lightheadedness with hypotension: unknown Has patient had a PCN reaction causing severe rash involving mucus membranes or skin necrosis: unknown Has patient had a PCN reaction that required hospitalization: unknown Has patient had a PCN reaction occurring within the last 10 years: unknown If all of the above answers are "NO", then may proceed with Cephalosporin use.      Medication List    STOP taking these medications   efavirenz-emtricitabine-tenofovir 600-200-300 MG tablet Commonly known as:  ATRIPLA  TAKE these medications   benztropine 1 MG tablet Commonly known as:  COGENTIN Take 1 mg by mouth 2 (two) times daily.   cholecalciferol 1000 units tablet Commonly known as:  VITAMIN  D Take 1,000 Units by mouth daily.   divalproex 500 MG 24 hr tablet Commonly known as:  DEPAKOTE ER Take 1,000 mg by mouth 2 (two) times daily.   hydroxypropyl methylcellulose / hypromellose 2.5 % ophthalmic solution Commonly known as:  ISOPTO TEARS / GONIOVISC 1-2 drops as needed for dry eyes.   ibuprofen 600 MG tablet Commonly known as:  ADVIL,MOTRIN Take 600 mg by mouth every 6 (six) hours as needed.   MYRBETRIQ 25 MG Tb24 tablet Generic drug:  mirabegron ER Take 25 mg by mouth daily.   polyethylene glycol powder powder Commonly known as:  GLYCOLAX/MIRALAX Take 17 g by mouth 2 (two) times daily. Mix 17 grams in 8 ounces of water and give by mouth up to twice daily as needed for constipation   QUEtiapine 200 MG tablet Commonly known as:  SEROQUEL Take 200 mg by mouth every morning.   QUEtiapine 400 MG tablet Commonly known as:  SEROQUEL Take 1 tablet (400 mg total) by mouth at bedtime.   traZODone 100 MG tablet Commonly known as:  DESYREL Take 200 mg by mouth at bedtime.        DISCHARGE INSTRUCTIONS:  See AVS.  If you experience worsening of your admission symptoms, develop shortness of breath, life threatening emergency, suicidal or homicidal thoughts you must seek medical attention immediately by calling 911 or calling your MD immediately  if symptoms less severe.  You Must read complete instructions/literature along with all the possible adverse reactions/side effects for all the Medicines you take and that have been prescribed to you. Take any new Medicines after you have completely understood and accpet all the possible adverse reactions/side effects.   Please note  You were cared for by a hospitalist during your hospital stay. If you have any questions about your discharge medications or the care you received while you were in the hospital after you are discharged, you can call the unit and asked to speak with the hospitalist on call if the hospitalist that  took care of you is not available. Once you are discharged, your primary care physician will handle any further medical issues. Please note that NO REFILLS for any discharge medications will be authorized once you are discharged, as it is imperative that you return to your primary care physician (or establish a relationship with a primary care physician if you do not have one) for your aftercare needs so that they can reassess your need for medications and monitor your lab values.    On the day of Discharge:  VITAL SIGNS:  Blood pressure (!) 148/82, pulse (!) 106, temperature 97.6 F (36.4 C), temperature source Oral, resp. rate 18, height 5\' 7"  (1.702 m), weight 209 lb 4.8 oz (94.9 kg), SpO2 95 %. PHYSICAL EXAMINATION:  GENERAL:  61 y.o.-year-old patient lying in the bed with no acute distress. obeisity. EYES: Pupils equal, round, reactive to light and accommodation. No scleral icterus. Extraocular muscles intact.  HEENT: Head atraumatic, normocephalic. Oropharynx and nasopharynx clear.  NECK:  Supple, no jugular venous distention. No thyroid enlargement, no tenderness.  LUNGS: Normal breath sounds bilaterally, no wheezing, rales,rhonchi or crepitation. No use of accessory muscles of respiration.  CARDIOVASCULAR: S1, S2 normal. No murmurs, rubs, or gallops.  ABDOMEN: Soft, non-tender, non-distended. Bowel sounds present. No organomegaly  or mass.  EXTREMITIES: No pedal edema, cyanosis, or clubbing.  NEUROLOGIC: Cranial nerves II through XII are intact. Muscle strength 5/5 in all extremities. Sensation intact. Gait not checked.  PSYCHIATRIC: The patient looks demented .  SKIN: No obvious rash, lesion, or ulcer.  DATA REVIEW:   CBC Recent Labs  Lab 10/13/17 0946  WBC 10.6  HGB 12.5  HCT 37.7  PLT 127*    Chemistries  Recent Labs  Lab 10/12/17 0532  10/14/17 0332  NA 140   < > 145  K 4.0   < > 3.7  CL 117*   < > 119*  CO2 18*   < > 21*  GLUCOSE 104*   < > 108*  BUN 30*   < >  25*  CREATININE 1.58*   < > 1.12*  CALCIUM 9.1   < > 10.4*  AST 29  --   --   ALT 15  --   --   ALKPHOS 56  --   --   BILITOT 0.7  --   --    < > = values in this interval not displayed.     Microbiology Results  Results for orders placed or performed during the hospital encounter of 10/09/17  Blood Culture (routine x 2)     Status: None   Collection Time: 10/09/17 10:53 AM  Result Value Ref Range Status   Specimen Description BLOOD RIGHT ASSIST CONTROL  Final   Special Requests   Final    BOTTLES DRAWN AEROBIC AND ANAEROBIC Blood Culture results may not be optimal due to an inadequate volume of blood received in culture bottles   Culture NO GROWTH 5 DAYS  Final   Report Status 10/14/2017 FINAL  Final  Urine culture     Status: None   Collection Time: 10/09/17 10:53 AM  Result Value Ref Range Status   Specimen Description URINE, RANDOM  Final   Special Requests NONE  Final   Culture   Final    NO GROWTH Performed at [facility], [address] [place], [place] [address]    Report Status 10/11/2017 FINAL  Final  Blood Culture (routine x 2)     Status: None   Collection Time: 10/09/17 11:24 AM  Result Value Ref Range Status   Specimen Description BLOOD LEFT ASSIST CONTROL  Final   Special Requests   Final    BOTTLES DRAWN AEROBIC AND ANAEROBIC Blood Culture adequate volume   Culture NO GROWTH 5 DAYS  Final   Report Status 10/14/2017 FINAL  Final    RADIOLOGY:  No results found.   Management plans discussed with the patient, family and they are in agreement.  CODE STATUS: Full Code   TOTAL TIME TAKING CARE OF THIS PATIENT: 35 minutes.    Shaune Pollackhen, Sydnee Lamour M.D on 10/14/2017 at 9:44 AM  Between 7am to 6pm - Pager - [phone]  After 6pm go to [email or web address] - Social research officer, governmentpassword [email or web address]  [facility] [place] [facility]  Office  [phone]  CC: Primary care physician; [name], [name], MD   Note: This dictation was prepared with Dragon dictation along with  smaller phrase technology. Any transcriptional errors that result from this process are unintentional.

## 2017-10-14 NOTE — Care Management Important Message (Signed)
Important Message  Patient Details  Name: Melanie Hahn MRN: [id number] Date of Birth: July 26, 1956   Medicare Important Message Given:  Yes    Collie SiadAngela Kleber Crean, RN 10/14/2017, 10:37 AM

## 2017-10-14 NOTE — Discharge Instructions (Signed)
Low fat diet

## 2017-11-01 DIAGNOSIS — F250 Schizoaffective disorder, bipolar type: Secondary | ICD-10-CM | POA: Insufficient documentation

## 2017-11-01 DIAGNOSIS — Z8619 Personal history of other infectious and parasitic diseases: Secondary | ICD-10-CM | POA: Insufficient documentation

## 2017-11-01 DIAGNOSIS — A4902 Methicillin resistant Staphylococcus aureus infection, unspecified site: Secondary | ICD-10-CM | POA: Insufficient documentation

## 2017-11-03 ENCOUNTER — Ambulatory Visit: Payer: Self-pay | Admitting: Surgery

## 2017-11-19 ENCOUNTER — Ambulatory Visit (INDEPENDENT_AMBULATORY_CARE_PROVIDER_SITE_OTHER): Payer: Medicare Other | Admitting: General Surgery

## 2017-11-19 ENCOUNTER — Encounter: Payer: Self-pay | Admitting: General Surgery

## 2017-11-19 VITALS — BP 150/98 | HR 106 | Temp 97.9°F | Wt 201.7 lb

## 2017-11-19 DIAGNOSIS — Z8719 Personal history of other diseases of the digestive system: Secondary | ICD-10-CM | POA: Diagnosis not present

## 2017-11-19 NOTE — Progress Notes (Signed)
Outpatient Surgical Follow Up  11/19/2017  Smith RobertSheila Gilardi is an 61 y.o. female.   Chief Complaint  Patient presents with  . Follow-up    follow up (10/09/17)Acute pancreatitis    HPI: 61 year old female reports to clinic for follow-up of recent hospitalization for pancreatitis.  Patient is now in a facility due to her numerous other medical problems including schizophrenia.  Patient and her caregiver were not sure why she had a surgery follow-up on presentation today.  Patient reports she does have occasional abdominal discomfort but attest that to constipation.  She otherwise states she has been eating well and denies any fevers, chills, nausea, vomiting, chest pain, shortness of breath.  Her schizophrenia is much better controlled on her current medications.  Past Medical History:  Diagnosis Date  . Hepatitis C   . HIV (human immunodeficiency virus infection) (HCC)   . Obesity   . Schizophrenia (HCC)     No past surgical history on file.  Patient denies any memory of previous surgeries.  Family History  Problem Relation Age of Onset  . Alzheimer's disease Mother   . Diabetes Sister   . Hypertension Sister   . Breast cancer Neg Hx     Social History:  reports that she has been smoking cigarettes.  she has never used smokeless tobacco. She reports that she does not drink alcohol or use drugs.  Allergies:  Allergies  Allergen Reactions  . Penicillins Other (See Comments)    Has patient had a PCN reaction causing immediate rash, facial/tongue/throat swelling, SOB or lightheadedness with hypotension: unknown Has patient had a PCN reaction causing severe rash involving mucus membranes or skin necrosis: unknown Has patient had a PCN reaction that required hospitalization: unknown Has patient had a PCN reaction occurring within the last 10 years: unknown If all of the above answers are "NO", then may proceed with Cephalosporin use.   . Sulfamethoxazole-Trimethoprim Other (See  Comments)    Other Reaction: fever and leukopenia  . Vancomycin Other (See Comments)    Other Reaction: fever and leukopenia?    Medications reviewed.    ROS A multipoint review of systems was completed, all pertinent positives and negatives were document within the HPI and the remainder are negative   BP (!) 150/98   Pulse (!) 106   Temp 97.9 F (36.6 C) (Oral)   Wt 91.5 kg (201 lb 11.5 oz)   BMI 31.59 kg/m   Physical Exam General: No acute distress Neck: Supple nontender Chest: Clear to auscultation Heart: Tachycardic Abdomen: Soft, nontender, nondistended    No results found for this or any previous visit (from the past 48 hour(s)). No results found.  Assessment/Plan:  1. H/O acute pancreatitis 61 year old female with multiple medical problems and a recent admission for pancreatitis.  She had a CT scan performed which was questionable for sludge but no stones.  She has never had a right upper quadrant ultrasound.  Discussed with the patient that the most common cause for her pancreatitis is of a biliary source and that we would perform an ultrasound to prove whether or not she has any stones in her gallbladder.  If she does we would then offer a potential cholecystectomy if she does not then the likely causes would be 1 of her other medical problems or chronic medications.  Her follow-up will be dependent upon her ultrasound and its results. - US Abdomen Complete; Future  A total of 15 minutes was used on this encounter with greater than 50%  of it used for counseling or coordination of care.   Ricarda Frameharles Vann Okerlund, MD FACS General Surgeon  11/19/2017,10:55 AM

## 2017-11-19 NOTE — Addendum Note (Signed)
Addended by: Adela PortsBONICHE, Arbutus Nelligan on: 11/19/2017 11:11 AM   Modules accepted: Orders

## 2017-11-19 NOTE — Patient Instructions (Addendum)
We have scheduled you for an Ultrasound of your Abdomen. We will call you with the details of this appointment as soon as this is scheduled.  Bring a list of medications with you to your appointment.  Please do not have anything to eat or drink after midnight prior to your Ultrasound.   If you need to reschedule your Ultrasound, you may do so by calling [phone].   Abdominal Ultrasound An ultrasound is a test that uses sound waves to take pictures of the inside of the body. An abdominal ultrasound takes pictures of the inside of the abdomen, and a pelvic ultrasound takes pictures of the inside of the pelvis. An abdominal or pelvic ultrasound may be done:  To provide information about a baby developing in the womb, such as the baby's position and heartbeat.  To check the shape or size of an organ.  To check for problems such as: ? Cysts. ? Masses. ? Inflammation. ? Kidney stones. ? Gallstones.  Tell a health care provider about:  Any allergies you have.  All medicines you are taking, including vitamins, herbs, eye drops, creams, and over-the-counter medicines.  Previous surgeries you have had.  Any medical conditions you have.  Whether you are pregnant or may be pregnant. What are the risks? There are no known risks or complications from having this test. What happens before the procedure?  Follow instructions from your health care provider about eating or drinking restrictions.  Wear clothing that is easily washable in case the gel used for the test gets on your clothes. What happens during the procedure?  A gel will be applied to your skin. It may feel cool.  A wand-like device called a transducer will be placed on the area to be examined.  The transducer will take pictures. These will be displayed on one or more monitors that look like small TV screens. What happens after the procedure?  It is your responsibility to get your test results. Ask your health care  provider or the department performing the test when your results will be ready.  Keep follow-up visits as told by your health care provider. This is important. This information is not intended to replace advice given to you by your health care provider. Make sure you discuss any questions you have with your health care provider. Document Released: 11/20/2000 Document Revised: 08/05/2016 Document Reviewed: 08/16/2015 Elsevier Interactive Patient Education  2018 [employer]

## 2017-11-24 ENCOUNTER — Ambulatory Visit: Payer: Medicare Other

## 2017-11-24 ENCOUNTER — Ambulatory Visit: Payer: Self-pay | Admitting: Surgery

## 2017-12-02 ENCOUNTER — Ambulatory Visit
Admission: RE | Admit: 2017-12-02 | Discharge: 2017-12-02 | Disposition: A | Discharge status: Home / Self Care | Payer: Medicare Other | Source: Ambulatory Visit | Attending: General Surgery | Admitting: General Surgery

## 2017-12-02 ENCOUNTER — Ambulatory Visit: Payer: Self-pay | Admitting: Surgery

## 2017-12-02 DIAGNOSIS — R932 Abnormal findings on diagnostic imaging of liver and biliary tract: Secondary | ICD-10-CM | POA: Diagnosis not present

## 2017-12-02 DIAGNOSIS — R1011 Right upper quadrant pain: Secondary | ICD-10-CM | POA: Diagnosis not present

## 2017-12-02 DIAGNOSIS — Z8719 Personal history of other diseases of the digestive system: Secondary | ICD-10-CM | POA: Insufficient documentation

## 2017-12-03 ENCOUNTER — Ambulatory Visit: Payer: Self-pay | Admitting: Surgery

## 2017-12-08 ENCOUNTER — Ambulatory Visit (INDEPENDENT_AMBULATORY_CARE_PROVIDER_SITE_OTHER): Payer: Medicare Other | Admitting: General Surgery

## 2017-12-08 ENCOUNTER — Encounter: Payer: Self-pay | Admitting: General Surgery

## 2017-12-08 VITALS — BP 128/85 | HR 106 | Temp 98.3°F | Ht 67.0 in | Wt 186.0 lb

## 2017-12-08 DIAGNOSIS — K859 Acute pancreatitis without necrosis or infection, unspecified: Secondary | ICD-10-CM

## 2017-12-08 NOTE — Patient Instructions (Signed)
Please call our office if you have questions or concerns.   

## 2017-12-08 NOTE — Progress Notes (Signed)
Outpatient Surgical Follow Up  12/08/2017  Melanie RobertSheila Hahn is an 62 y.o. female.   Chief Complaint  Patient presents with  . Follow-up    acute Pancreatitis-discuss US results    HPI: 62 year old female returns to clinic for follow-up for a hospital admission of pancreatitis.  Patient reports having no abdominal pain since discharge.  Had an ultrasound obtained since her last visit.  Although she has fallen at the care home she is currently residing and she denies any abdominal pain.  She denies any fevers, chills, nausea, vomiting, chest pain, shortness of breath, diarrhea, constipation.  Past Medical History:  Diagnosis Date  . Hepatitis C   . HIV (human immunodeficiency virus infection) (HCC)   . Obesity   . Schizophrenia (HCC)     History reviewed. No pertinent surgical history.  Family History  Problem Relation Age of Onset  . Alzheimer's disease Mother   . Diabetes Sister   . Hypertension Sister   . Breast cancer Neg Hx     Social History:  reports that she has been smoking cigarettes.  she has never used smokeless tobacco. She reports that she does not drink alcohol or use drugs.  Allergies:  Allergies  Allergen Reactions  . Penicillins Other (See Comments)    Has patient had a PCN reaction causing immediate rash, facial/tongue/throat swelling, SOB or lightheadedness with hypotension: unknown Has patient had a PCN reaction causing severe rash involving mucus membranes or skin necrosis: unknown Has patient had a PCN reaction that required hospitalization: unknown Has patient had a PCN reaction occurring within the last 10 years: unknown If all of the above answers are "NO", then may proceed with Cephalosporin use.   . Sulfamethoxazole-Trimethoprim Other (See Comments)    Other Reaction: fever and leukopenia  . Vancomycin Other (See Comments)    Other Reaction: fever and leukopenia?    Medications reviewed.    ROS A multipoint review of systems was completed,  all pertinent positives and negatives are documented within the HPI and the remainder are negative   BP 128/85   Pulse (!) 106   Temp 98.3 F (36.8 C) (Oral)   Ht 5\' 7"  (1.702 m)   Wt 84.4 kg (186 lb)   BMI 29.13 kg/m   Physical Exam General: No acute distress Neck: Supple nontender Chest: Clear to auscultation Heart: Tachycardic Abdomen: Soft, nontender, nondistended.    Imaging results that are reviewed:   Right upper quadrant ultrasound: Gallbladder:  No gallstones or wall thickening visualized. No sonographic Murphy sign noted by sonographer.  IMPRESSION: Diffusely increased parenchymal echogenicity of the liver, usually associated with hepatic steatosis.  Normal appearance of the gallbladder.  Assessment/Plan:  1. Acute pancreatitis, unspecified complication status, unspecified pancreatitis type 62 year old female returns to clinic for follow-up after hospitalization for pancreatitis.  Right upper quadrant ultrasound failed to show any evidence of cholelithiasis.  Discussed with the patient and the caregiver that is with her that in the absence of cholelithiasis there is no indication for prophylactic cholecystectomy to prevent pancreatitis.  Discussed should her symptoms return we would possibly reevaluate but it is more likely that her pancreatitis is from 1 of the other many possible causes of pancreatitis.  Patient can follow-up in clinic on an as-needed basis.  Patient will follow up with her primary caregiver next week.  A total of 15 minutes was used on this encounter with greater than 50% of it used for counseling or coordination of care.   Melanie Frameharles Moxie Kalil, MD FACS General  Surgeon  12/08/2017,9:47 AM

## 2017-12-10 ENCOUNTER — Emergency Department
Admission: EM | Admit: 2017-12-10 | Discharge: 2017-12-10 | Disposition: A | Discharge status: Home / Self Care | Payer: Medicare Other | Attending: Emergency Medicine | Admitting: Emergency Medicine

## 2017-12-10 ENCOUNTER — Emergency Department: Payer: Medicare Other

## 2017-12-10 DIAGNOSIS — Z79899 Other long term (current) drug therapy: Secondary | ICD-10-CM | POA: Diagnosis not present

## 2017-12-10 DIAGNOSIS — F17210 Nicotine dependence, cigarettes, uncomplicated: Secondary | ICD-10-CM | POA: Diagnosis not present

## 2017-12-10 DIAGNOSIS — R252 Cramp and spasm: Secondary | ICD-10-CM | POA: Diagnosis present

## 2017-12-10 DIAGNOSIS — B20 Human immunodeficiency virus [HIV] disease: Secondary | ICD-10-CM | POA: Diagnosis not present

## 2017-12-10 DIAGNOSIS — M62838 Other muscle spasm: Secondary | ICD-10-CM | POA: Diagnosis not present

## 2017-12-10 LAB — URINALYSIS, COMPLETE (UACMP) WITH MICROSCOPIC
BILIRUBIN URINE: NEGATIVE
Bacteria, UA: NONE SEEN
GLUCOSE, UA: NEGATIVE mg/dL
HGB URINE DIPSTICK: NEGATIVE
KETONES UR: NEGATIVE mg/dL
LEUKOCYTES UA: NEGATIVE
NITRITE: NEGATIVE
PH: 6 (ref 5.0–8.0)
Protein, ur: NEGATIVE mg/dL
RBC / HPF: NONE SEEN RBC/hpf (ref 0–5)
SPECIFIC GRAVITY, URINE: 1.016 (ref 1.005–1.030)

## 2017-12-10 LAB — COMPREHENSIVE METABOLIC PANEL
ALBUMIN: 3.7 g/dL (ref 3.5–5.0)
ALK PHOS: 53 U/L (ref 38–126)
ALT: 9 U/L — ABNORMAL LOW (ref 14–54)
AST: 19 U/L (ref 15–41)
Anion gap: 9 (ref 5–15)
BILIRUBIN TOTAL: 0.6 mg/dL (ref 0.3–1.2)
BUN: 12 mg/dL (ref 6–20)
CALCIUM: 10.2 mg/dL (ref 8.9–10.3)
CO2: 26 mmol/L (ref 22–32)
Chloride: 105 mmol/L (ref 101–111)
Creatinine, Ser: 0.93 mg/dL (ref 0.44–1.00)
GFR calc non Af Amer: >60 mL/min (ref >60)
GLUCOSE: 97 mg/dL (ref 65–99)
POTASSIUM: 4.4 mmol/L (ref 3.5–5.1)
SODIUM: 140 mmol/L (ref 135–145)
TOTAL PROTEIN: 7.2 g/dL (ref 6.5–8.1)

## 2017-12-10 LAB — CBC WITH DIFFERENTIAL/PLATELET
BASOS PCT: 0 %
Basophils Absolute: 0 10*3/uL (ref 0–0.1)
EOS ABS: 0 10*3/uL (ref 0–0.7)
Eosinophils Relative: 1 %
HCT: 40.4 % (ref 35.0–47.0)
HEMOGLOBIN: 13.4 g/dL (ref 12.0–16.0)
LYMPHS ABS: 2.6 10*3/uL (ref 1.0–3.6)
Lymphocytes Relative: 54 %
MCH: 32.4 pg (ref 26.0–34.0)
MCHC: 33.1 g/dL (ref 32.0–36.0)
MCV: 97.7 fL (ref 80.0–100.0)
MONO ABS: 0.5 10*3/uL (ref 0.2–0.9)
Monocytes Relative: 9 %
Neutro Abs: 1.8 10*3/uL (ref 1.4–6.5)
Neutrophils Relative %: 36 %
Platelets: 119 10*3/uL — ABNORMAL LOW (ref 150–440)
RBC: 4.14 MIL/uL (ref 3.80–5.20)
RDW: 14.9 % — AB (ref 11.5–14.5)
WBC: 5 10*3/uL (ref 3.6–11.0)

## 2017-12-10 NOTE — ED Triage Notes (Signed)
Pt brought in by EMS, from a group home.  Pt states she lives at "[facility]" group home.  Pt is alert and oriented, but forgetful.  Pt states she has been having leg cramps and back cramps that are causing her to fall more frequently.  Pt also states that her legs are stiff.  Pt states that her most recent fall was today.  Pt in NAD at this time.

## 2017-12-10 NOTE — ED Triage Notes (Signed)
Called for room with no answer.  °

## 2017-12-10 NOTE — ED Provider Notes (Signed)
[facility] Emergency Department Provider Note  ____________________________________________  Time seen: Approximately 6:40 PM  I have reviewed the triage vital signs and the nursing notes.   HISTORY  Chief Complaint Muscle cramps Level 5 Caveat: Portions of the History and Physical are unable to be obtained due to patient being a poor historian   HPI Melanie Hahn is a 62 y.o. female with a history of schizophrenia who comes to the ED from her group home complaining of leg cramps and back cramps that are intermittent. Denies paresthesias or motor weakness. No precipitating factors, no aggravating or alleviating factors. States that she's had multiple falls because her legs just give out on her. Other times she is able to walk just fine. No head injuries. No headache or vision changes. No recent illness.     Past Medical History:  Diagnosis Date  . Hepatitis C   . HIV (human immunodeficiency virus infection) (HCC)   . Obesity   . Schizophrenia [facility])      Patient Active Problem List   Diagnosis Date Noted  . History of hepatitis C virus infection 11/01/2017  . MRSA (methicillin resistant Staphylococcus aureus) 11/01/2017  . Schizoaffective disorder, bipolar type (HCC) 11/01/2017  . Abdominal pain 10/09/2017  . Acute pancreatitis 10/09/2017  . Intractable nausea and vomiting 10/09/2017  . HIV antibody positive (HCC) 10/09/2017  . Hypercalcemia 11/27/2016  . Schizophrenia (HCC) 07/15/2016  . Framingham cardiac risk 10-20% in next 10 years 05/28/2016  . Encounter for long-term (current) use of medications 03/22/2014  . Obesity with body mass index of 30.0-39.9 03/10/2012  . Asymptomatic HIV infection, CD4 >=500 (HCC) 02/09/2012  . Tobacco use 02/09/2012  . 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 No pertinent surgical history.   Prior to Admission medications   Medication Sig Start Date End Date Taking? Authorizing Provider   benztropine (COGENTIN) 1 MG tablet Take 1 mg by mouth 2 (two) times daily.    [provider]  bictegravir-emtricitabine-tenofovir AF (BIKTARVY) 50-200-25 MG TABS tablet Take by mouth. 10/15/17   [provider]  cholecalciferol (VITAMIN D) 1000 units tablet Take 1,000 Units by mouth daily.    [provider]  divalproex (DEPAKOTE ER) 500 MG 24 hr tablet Take 1,000 mg by mouth 2 (two) times daily.     [provider]  ibuprofen (ADVIL,MOTRIN) 600 MG tablet Take 600 mg by mouth every 6 (six) hours as needed.    [provider]  mirabegron ER (MYRBETRIQ) 25 MG TB24 tablet Take 25 mg by mouth daily.    [provider]  polyethylene glycol powder (GLYCOLAX/MIRALAX) powder Take 17 g by mouth 2 (two) times daily. Mix 17 grams in 8 ounces of water and give by mouth up to twice daily as needed for constipation    [provider]  polyvinyl alcohol (LIQUIFILM TEARS) 1.4 % ophthalmic solution Place 1 drop into both eyes 2 (two) times daily.    [provider]  QUEtiapine (SEROQUEL) 200 MG tablet Take 200 mg by mouth every morning.    [provider]  QUEtiapine (SEROQUEL) 400 MG tablet Take 1 tablet (400 mg total) by mouth at bedtime. 07/22/16   Clapacs, Jackquline Denmark, MD  traZODone (DESYREL) 100 MG tablet Take 200 mg by mouth at bedtime.     [provider]     Allergies Penicillins; Sulfamethoxazole-trimethoprim; and Vancomycin   Family History  Problem Relation Age of Onset  . Alzheimer's disease Mother   .  Diabetes Sister   . Hypertension Sister   . Breast cancer Neg Hx     Social History Social History   Tobacco Use  . Smoking status: Current Some Day Smoker    Types: Cigarettes  . Smokeless tobacco: Never Used  Substance Use Topics  . Alcohol use: No  . Drug use: No    Review of Systems  Constitutional:   No fever or chills.  ENT:   No sore throat. No rhinorrhea. Cardiovascular:   No chest pain or  syncope. Respiratory:   No dyspnea or cough. Gastrointestinal:   Negative for abdominal pain, vomiting and diarrhea.  Musculoskeletal:   Negative for focal pain or swelling All other systems reviewed and are negative except as documented above in ROS and HPI.  ____________________________________________   PHYSICAL EXAM:  VITAL SIGNS: ED Triage Vitals  Enc Vitals Group     BP 12/10/17 1612 138/88     Pulse Rate 12/10/17 1612 91     Resp 12/10/17 1612 18     Temp 12/10/17 1612 97.7 F (36.5 C)     Temp Source 12/10/17 1612 Oral     SpO2 12/10/17 1612 100 %     Weight 12/10/17 1613 186 lb (84.4 kg)     Height --      Head Circumference --      Peak Flow --      Pain Score 12/10/17 1611 8     Pain Loc --      Pain Edu? --      Excl. in GC? --     Vital signs reviewed, nursing assessments reviewed.   Constitutional:   Alert and oriented. Well appearing and in no distress. Eyes:   No scleral icterus.  EOMI. No nystagmus. No conjunctival pallor. PERRL. ENT   Head:   Normocephalic and atraumatic.   Nose:   No congestion/rhinnorhea.    Mouth/Throat:   MMM, no pharyngeal erythema. No peritonsillar mass.    Neck:   No meningismus. Full ROM. Hematological/Lymphatic/Immunilogical:   No cervical lymphadenopathy. Cardiovascular:   RRR. Symmetric bilateral radial and DP pulses.  No murmurs.  Respiratory:   Normal respiratory effort without tachypnea/retractions. Breath sounds are clear and equal bilaterally. No wheezes/rales/rhonchi. Gastrointestinal:   Soft and nontender. Non distended. There is no CVA tenderness.  No rebound, rigidity, or guarding. Genitourinary:   deferred Musculoskeletal:   Normal range of motion in all extremities. No joint effusions.  No lower extremity tenderness.  No edema. Bilateral lower back tenderness in the musculature. Neurologic:   Normal speech and language.  Motor grossly intact. Ambulatory with normal gait. No ataxia. Normal,  symmetric patellar reflexes bilaterally No gross focal neurologic deficits are appreciated.  Skin:    Skin is warm, dry and intact. No rash noted.  No petechiae, purpura, or bullae.  ____________________________________________    LABS (pertinent positives/negatives) (all labs ordered are listed, but only abnormal results are displayed) Labs Reviewed  CBC WITH DIFFERENTIAL/PLATELET - Abnormal; Notable for the following components:      Result Value   RDW 14.9 (*)    Platelets 119 (*)    All other components within normal limits  COMPREHENSIVE METABOLIC PANEL - Abnormal; Notable for the following components:   ALT 9 (*)    All other components within normal limits  URINALYSIS, COMPLETE (UACMP) WITH MICROSCOPIC - Abnormal; Notable for the following components:   Color, Urine YELLOW (*)    APPearance CLEAR (*)    Squamous Epithelial /  LPF 0-5 (*)    All other components within normal limits   ____________________________________________   EKG    ____________________________________________    RADIOLOGY  Ct Head Wo Contrast  Result Date: 12/10/2017 CLINICAL DATA:  Multiple falls. EXAM: CT HEAD WITHOUT CONTRAST TECHNIQUE: Contiguous axial images were obtained from the base of the skull through the vertex without intravenous contrast. COMPARISON:  None. FINDINGS: Brain: No evidence of acute infarction, hemorrhage, hydrocephalus, extra-axial collection or mass lesion/mass effect. Relative mild cerebellar atrophy relative to the cerebrum as before. Vascular: No hyperdense vessel or unexpected calcification. Skull: Hyperostosis frontalis interna. No acute skull fracture or suspicious osseous lesions. Sinuses/Orbits: No acute finding. Other: None. IMPRESSION: No acute intracranial abnormality. Relative cerebellar atrophy as before. Electronically Signed   By: Tollie Ethavid  Kwon M.D.   On: 12/10/2017 18:36     ____________________________________________   PROCEDURES Procedures  ____________________________________________   DIFFERENTIAL DIAGNOSIS Stroke, TIA, musculoskeletal pain  CLINICAL IMPRESSION / ASSESSMENT AND PLAN / ED COURSE  Pertinent labs & imaging results that were available during my care of the patient were reviewed by me and considered in my medical decision making (see chart for details).   Patient well-appearing no acute distress unremarkable vital signs, presents with reports of intermittent muscle cramps that cause her to fall. No current evidence of dystonia. Low suspicion for stroke or intracranial hemorrhage. Because she is a poor historian related to her medical history, we'll pursue a more aggressive screening workup with CT head, labs. I think the patient is suitable for discharge home if there are no significant findings.   ----------------------------------------- 9:36 PM on 12/10/2017 -----------------------------------------  CT head unremarkable, stable from previous. Labs and urinalysis unremarkable. Discharge home to follow up with primary care. Vital signs are unremarkable, patient is calm and comfortable, no difficulties with ambulation here in the ED.     ____________________________________________   FINAL CLINICAL IMPRESSION(S) / ED DIAGNOSES    Final diagnoses:  Muscle spasms of both lower extremities       Portions of this note were generated with dragon dictation software. Dictation errors may occur despite best attempts at proofreading.        Sharman CheekStafford, [place]ra Hair, MD 12/10/17 2137

## 2017-12-10 NOTE — ED Notes (Signed)
Patient picked up by group home

## 2017-12-10 NOTE — ED Triage Notes (Signed)
FIRST NURSE NOTE-from assisted living off [address]. Here for multiple falls over past 2-3 days.  6 falls. From EMS.

## 2017-12-10 NOTE — ED Notes (Signed)
RN called group home to update on testing and discharge. Group home reports they will work on finding a ride for pt. RN also called and updated pts legal guardian.

## 2017-12-10 NOTE — ED Notes (Signed)
Pt also reports that she bumped her head this morning when she fell.  No bump noted to patient's head.

## 2018-03-17 ENCOUNTER — Other Ambulatory Visit: Payer: Self-pay | Admitting: Family Medicine

## 2018-03-17 DIAGNOSIS — Z1239 Encounter for other screening for malignant neoplasm of breast: Secondary | ICD-10-CM

## 2018-03-17 DIAGNOSIS — Z1231 Encounter for screening mammogram for malignant neoplasm of breast: Secondary | ICD-10-CM

## 2018-05-23 ENCOUNTER — Other Ambulatory Visit: Payer: Self-pay | Admitting: Family Medicine

## 2018-05-23 DIAGNOSIS — Z1231 Encounter for screening mammogram for malignant neoplasm of breast: Secondary | ICD-10-CM

## 2019-01-17 IMAGING — DX DG TOE 2ND 2+V*R*
3 series · 3 of 3 positions shown · non-contrast
Comparison: None.

CLINICAL DATA: Pt unable to answer questions. Hx from chart:
Tachycardia, smoker, "dry gangrene" right 2nd toe.

EXAM:
RIGHT SECOND TOE

[toe ap]
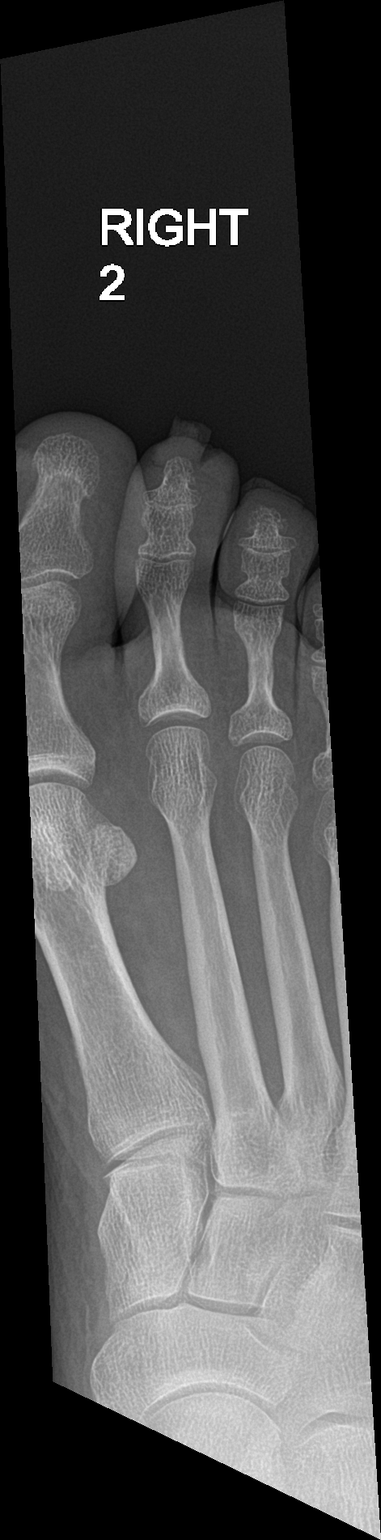

[toe obl]
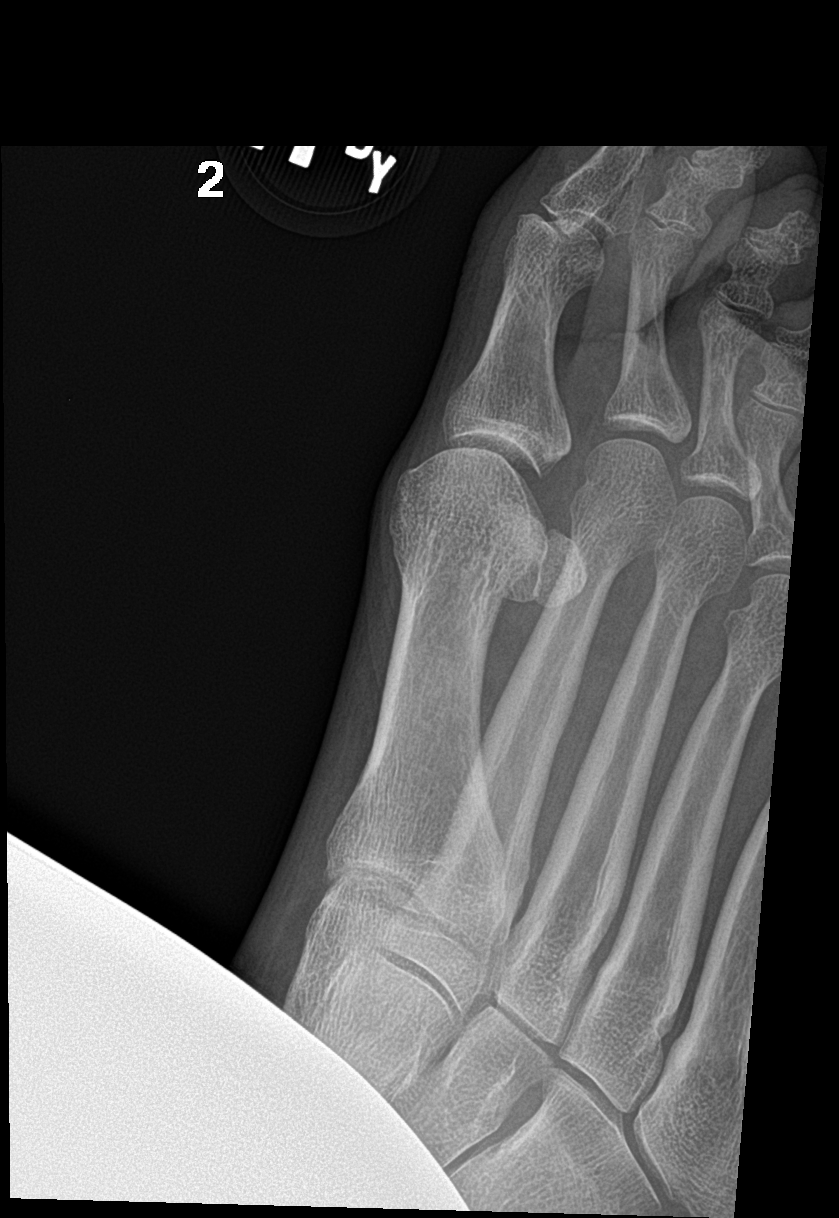

[toe lat]
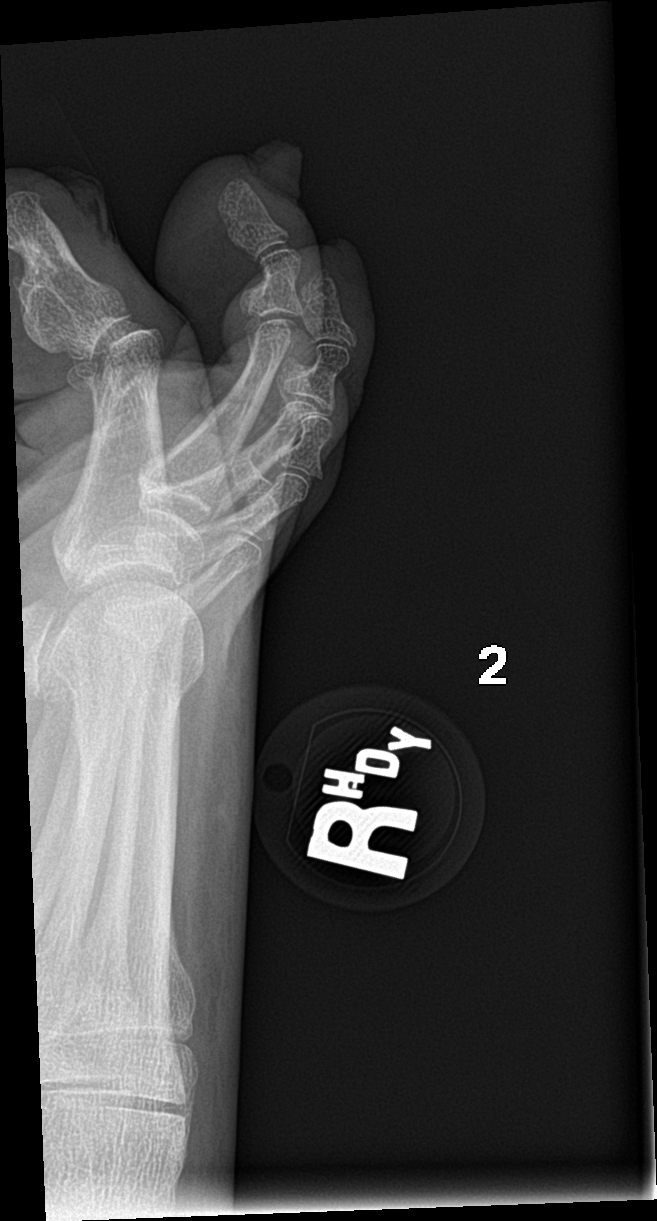

[3 of 3 positions shown; findings below may reference images not displayed]

FINDINGS: No fracture bone lesion.

The joints are normally spaced and aligned.

There are no areas of bone resorption to suggest osteomyelitis.

There is mild soft tissue swelling of the distal second toe. No soft
tissue air.
IMPRESSION: 1. No fracture or dislocation.
2. No evidence of osteomyelitis.

## 2019-01-17 IMAGING — CT CT ABD-PELV W/O CM
2 of 4 series · 16 of 46 positions shown, 18 images · non-contrast
Comparison: Abdomen and pelvis radiographs dated 09/23/2014.
Portable chest obtained earlier today.

CLINICAL DATA: Abdominal pain, nausea and vomiting. Tachycardia.
Shaking. Leukocytosis. Lactic acidosis. Smoker.

EXAM:
CT ABDOMEN AND PELVIS WITHOUT CONTRAST
TECHNIQUE: Multidetector CT imaging of the abdomen and pelvis was performed
following the standard protocol without IV contrast.

[Series 2: routine abd/pel wo · axial · 0.86mm/px · z∈[+58,+503]mm · 13 of 97 slices shown, 15 images]
[im 4/97  soft-tissue]
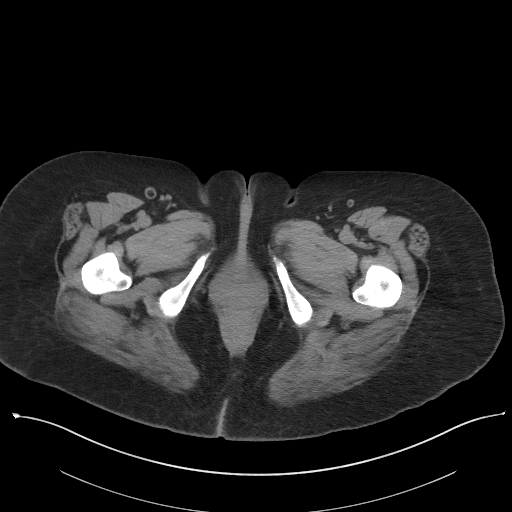
[im 4/97  bone]
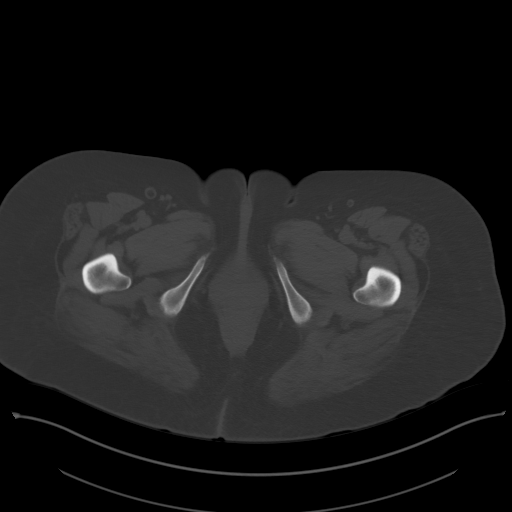
[im 12/97  soft-tissue]
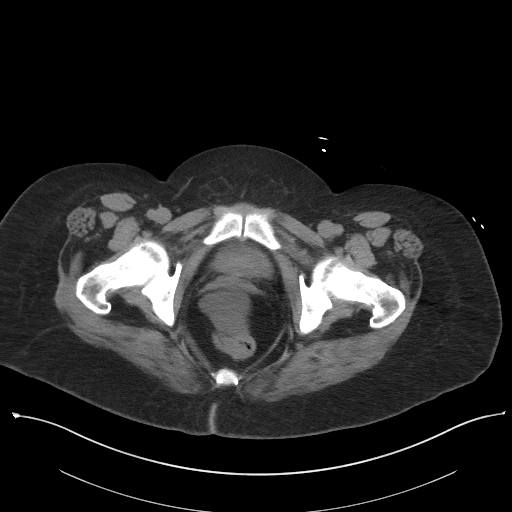
[im 20/97  soft-tissue]
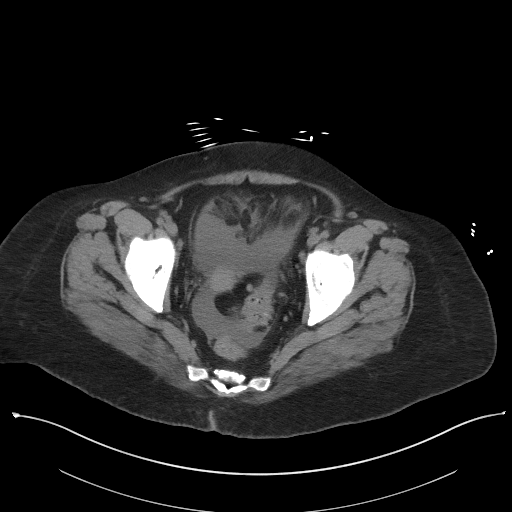
[im 27/97  soft-tissue]
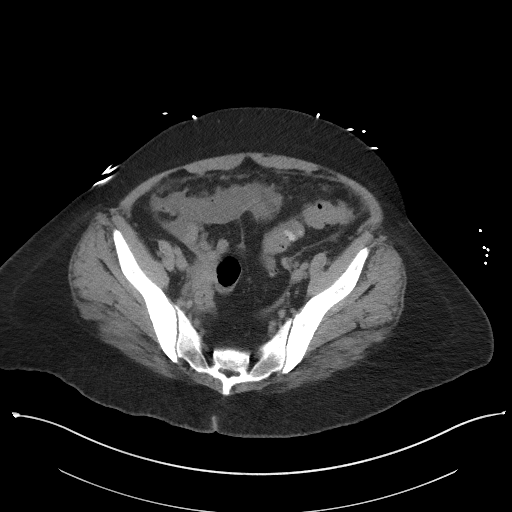
[im 35/97  soft-tissue]
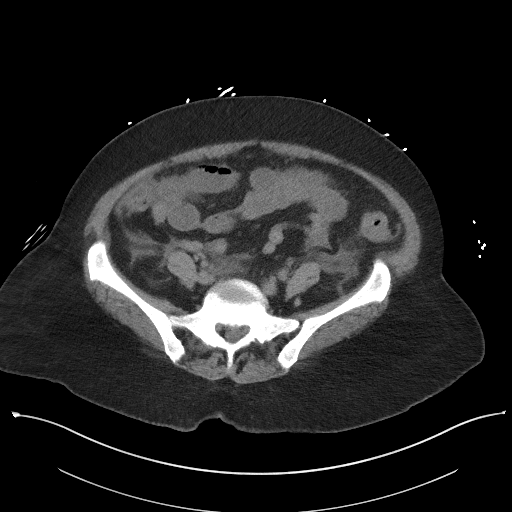
[im 43/97  soft-tissue]
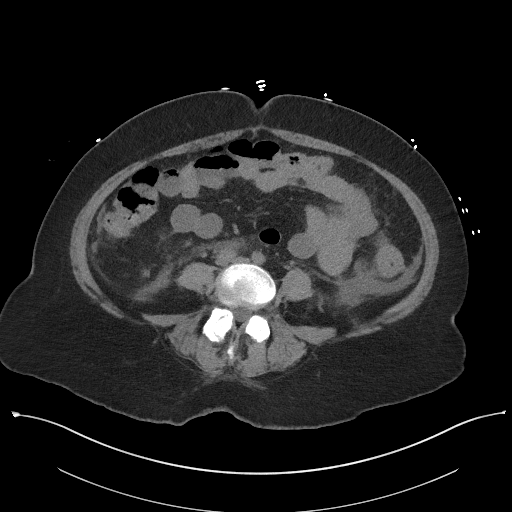
[im 50/97  soft-tissue]
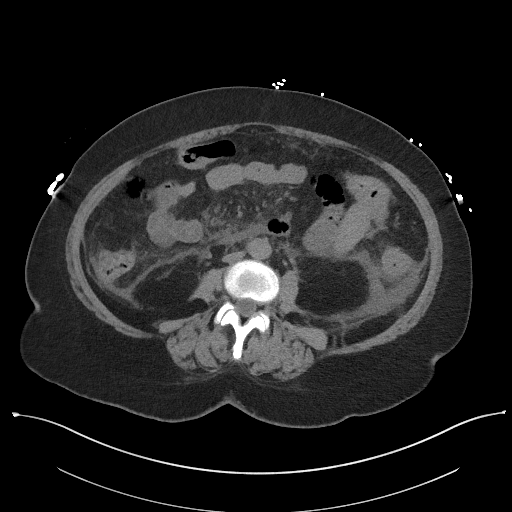
[im 54/97  soft-tissue]
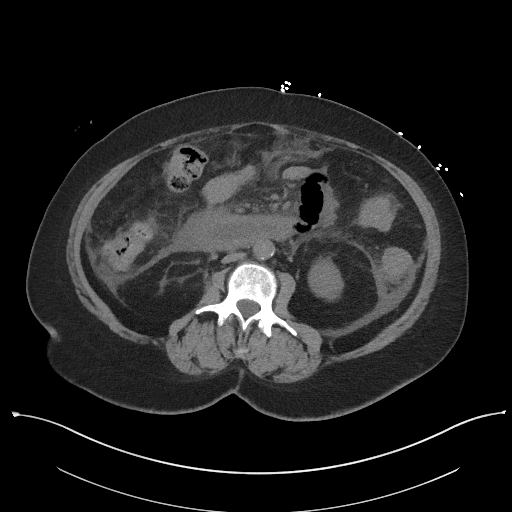
[im 62/97  soft-tissue]
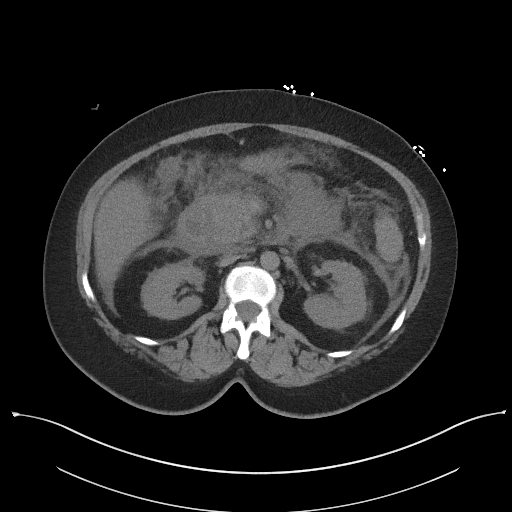
[im 62/97  bone]
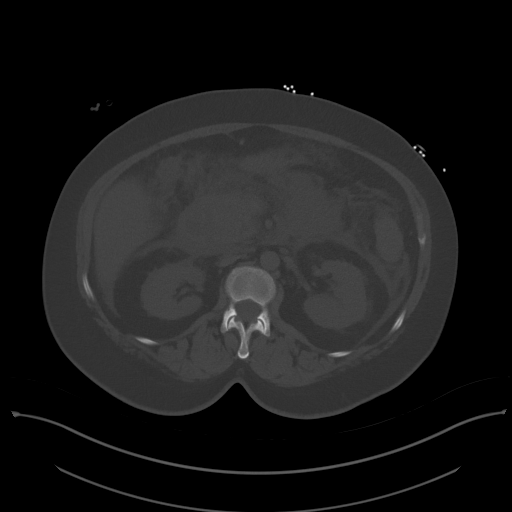
[im 70/97  soft-tissue]
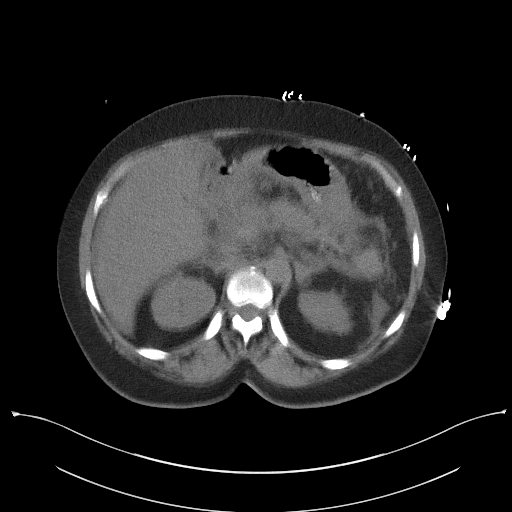
[im 77/97  soft-tissue]
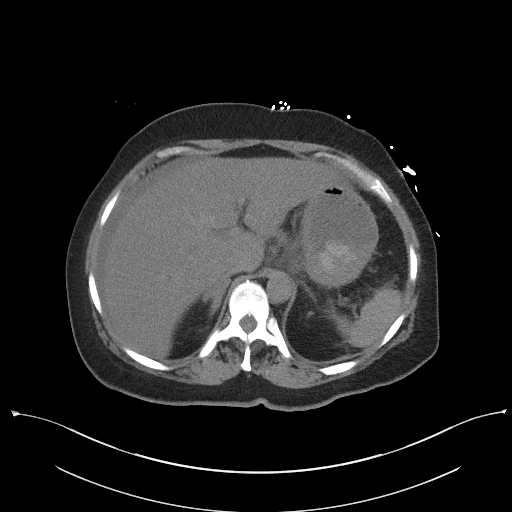
[im 85/97  soft-tissue]
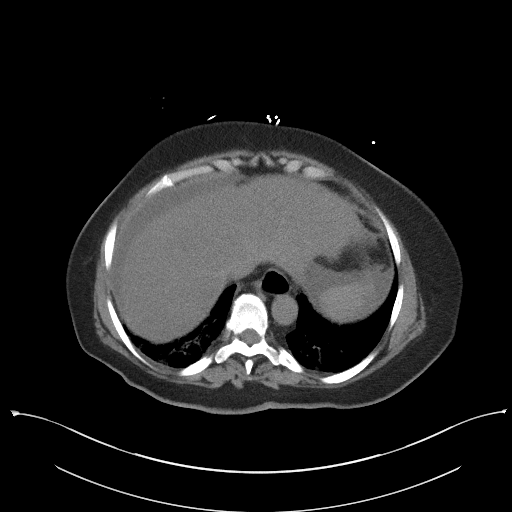
[im 93/97  soft-tissue]
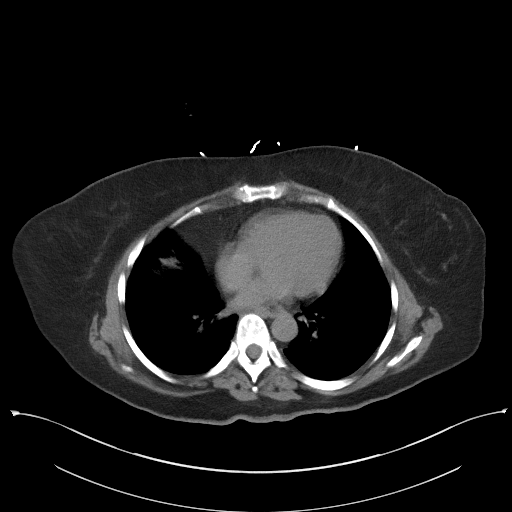

[Series 5: coronal st · coronal · 0.83mm/px · 3 of 100 slices shown]
[im 34/100  soft-tissue]
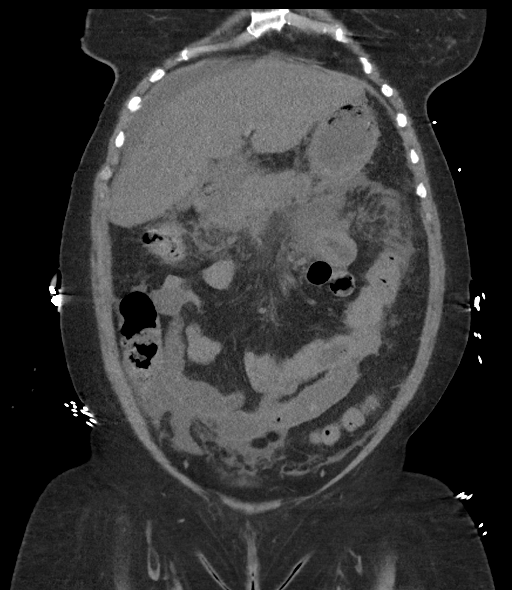
[im 45/100  soft-tissue]
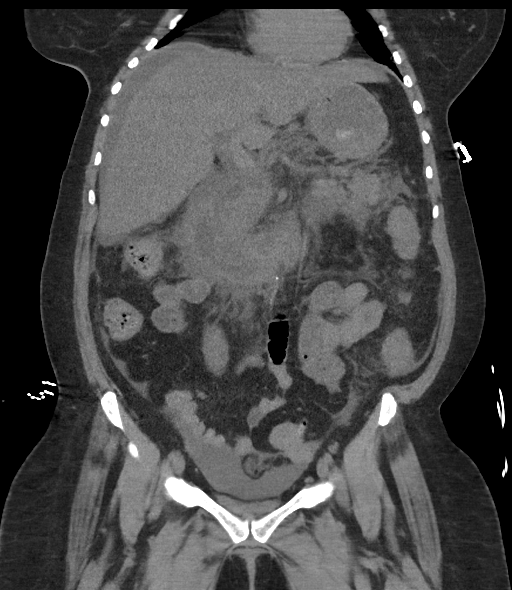
[im 56/100  soft-tissue]
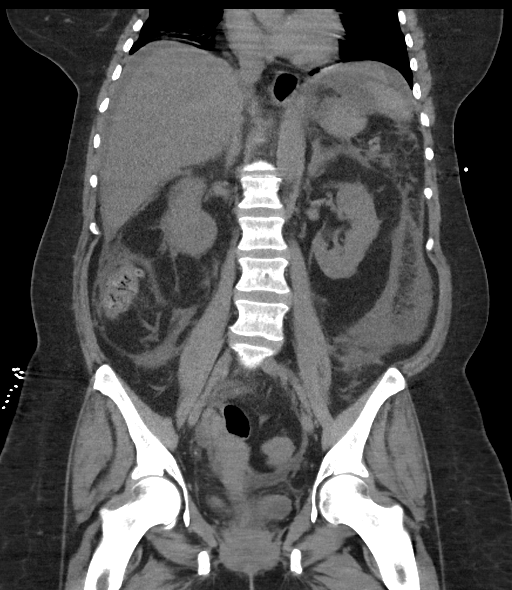

[16 of 46 positions shown; findings below may reference images not displayed]

FINDINGS: Lower chest: Prominent interstitial markings and mild patchy and
linear density at both lung bases. No pleural fluid. Normal sized
heart.

Hepatobiliary: Sludge in the gallbladder. Minimal diffuse low
density of the liver relative to the spleen.

Pancreas: Diffusely enlarged and heterogeneous pancreatic head with
poorly defined margins. Extensive bilateral peripancreatic soft
tissue stranding and fluid with fluid tracking inferiorly along the
paracolic gutters into the pelvis and around the spleen and in right
lobe of the liver.

Spleen: Normal in size without focal abnormality.

Adrenals/Urinary Tract: Adrenal glands are unremarkable. Kidneys are
normal, without renal calculi, focal lesion, or hydronephrosis.
Bladder is unremarkable.

Stomach/Bowel: Diffuse wall thickening involving the second, third
and fourth portions of the duodenum with fluid within the lumen.
Mild sigmoid colon diverticulosis. Normal appearing appendix. Mild
posterior gastric wall thickening.

Vascular/Lymphatic: Mild atheromatous arterial calcifications
without aneurysm. No enlarged lymph nodes.

Reproductive: Uterus and bilateral adnexa are unremarkable.

Other: Small to moderate amount of free peritoneal fluid in the
abdomen and pelvis, as described above.

Musculoskeletal: Mild lumbar and lower thoracic spine degenerative
changes. More pronounced facet degenerative changes in the lower
lumbar spine.
IMPRESSION: 1. Marked changes of acute pancreatitis involving the head of the
pancreas with extensive peripancreatic inflammatory changes,
associated gastric and duodenal wall thickening and small to
moderate amount of free peritoneal fluid.
2. Minimal diffuse hepatic steatosis.
3. Sludge in the gallbladder.
4. Mild sigmoid diverticulosis.
5. Probable combination of pneumonitis and atelectasis at both lung
bases with underlying chronic interstitial lung disease.

## 2019-01-17 IMAGING — CT CT HEAD W/O CM
3 series · 15 of 45 positions shown, 18 images · non-contrast
Comparison: None.

CLINICAL DATA: 61-year-old female with a history of altered mental
status, seizure

EXAM:
CT HEAD WITHOUT CONTRAST
TECHNIQUE: Contiguous axial images were obtained from the base of the skull
through the vertex without intravenous contrast.

[Series 2: head wo · axial · 0.47mm/px · z∈[-141,-26]mm · 9 of 28 slices shown, 12 images]
[im 3/28  brain]
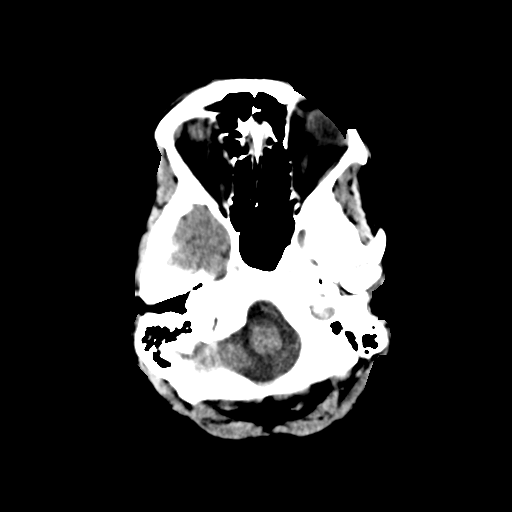
[im 3/28  bone]
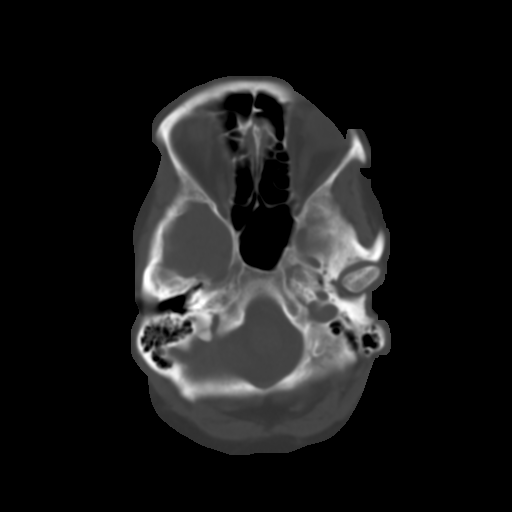
[im 6/28  brain]
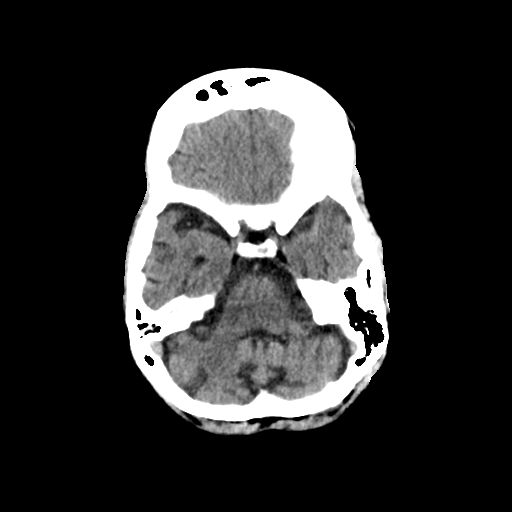
[im 9/28  brain]
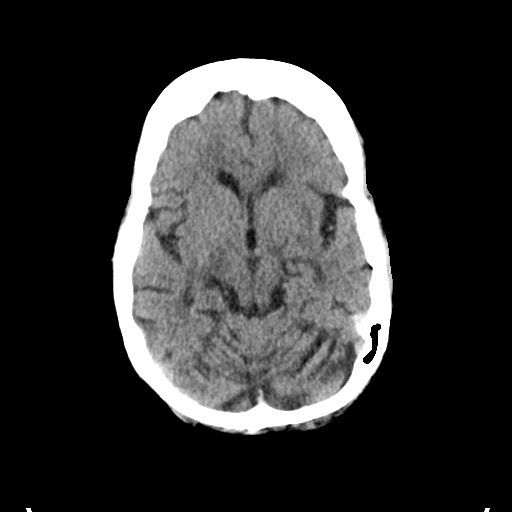
[im 12/28  brain]
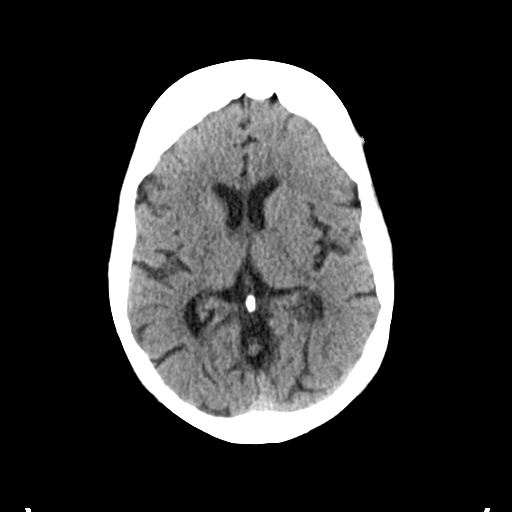
[im 15/28  brain]
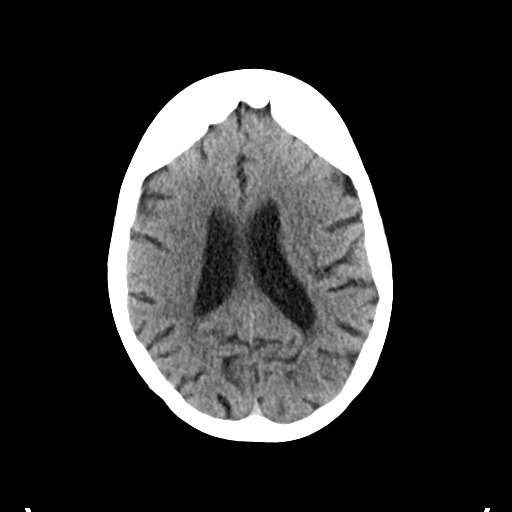
[im 15/28  bone]
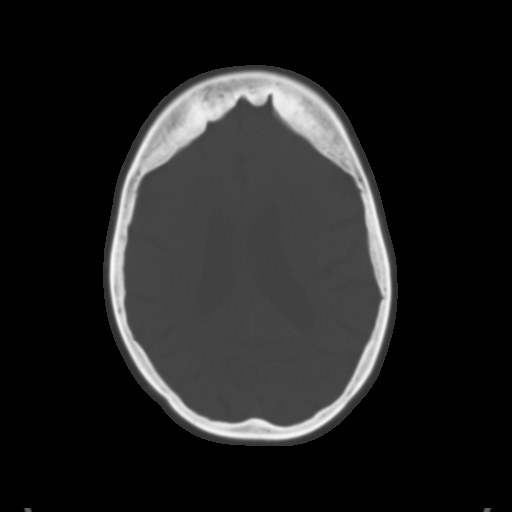
[im 17/28  brain]
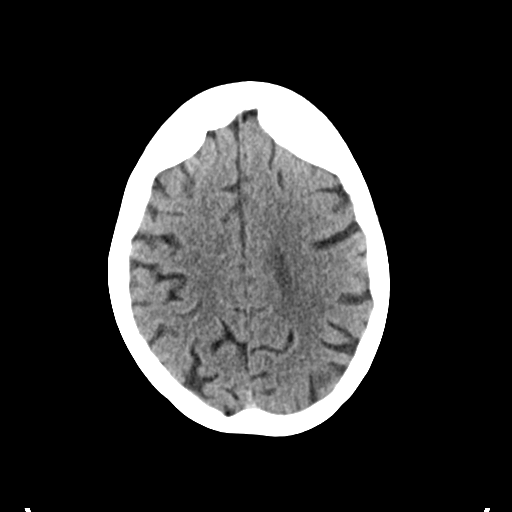
[im 20/28  brain]
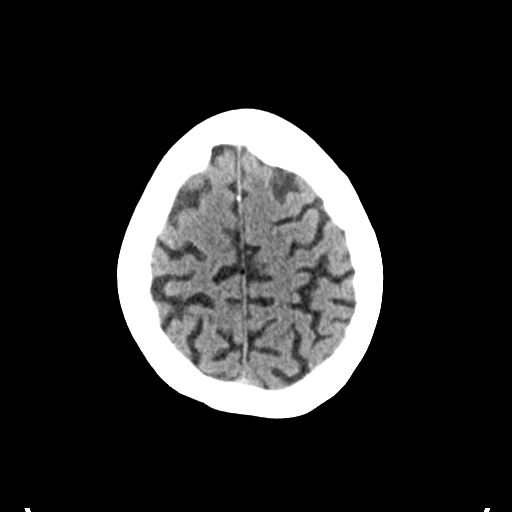
[im 23/28  brain]
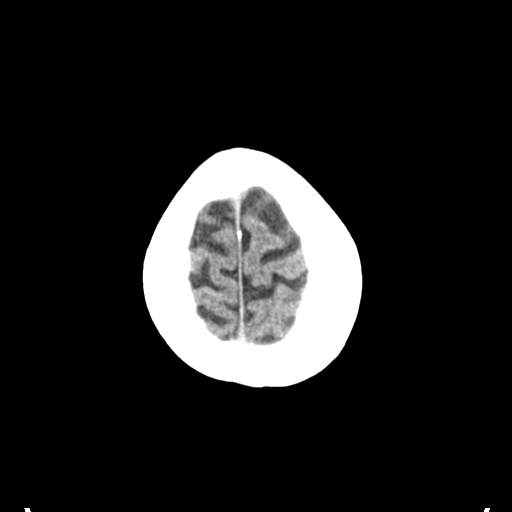
[im 26/28  brain]
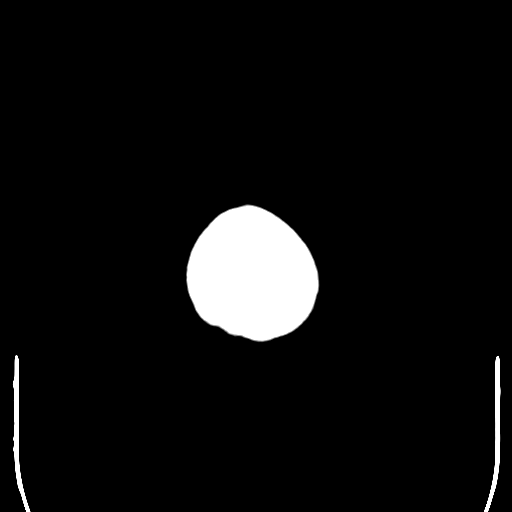
[im 26/28  bone]
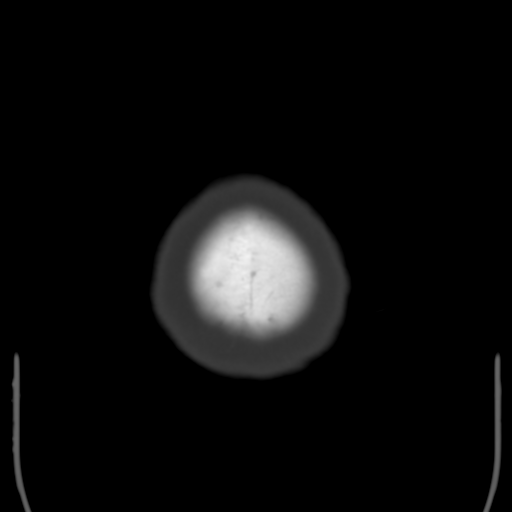

[Series 4: coronal soft tissue · coronal · 0.28mm/px · 3 of 67 slices shown]
[im 23/67  brain]
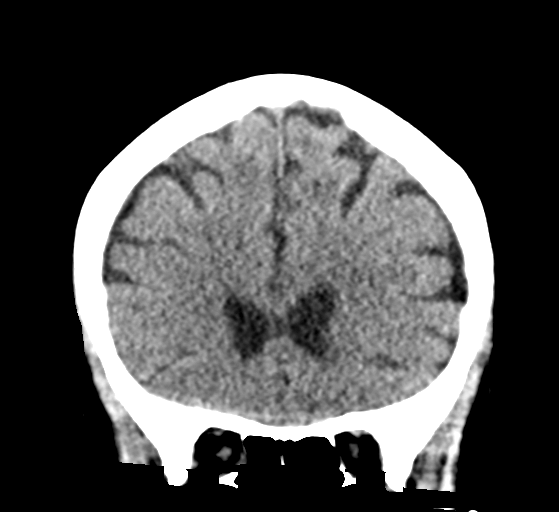
[im 30/67  brain]
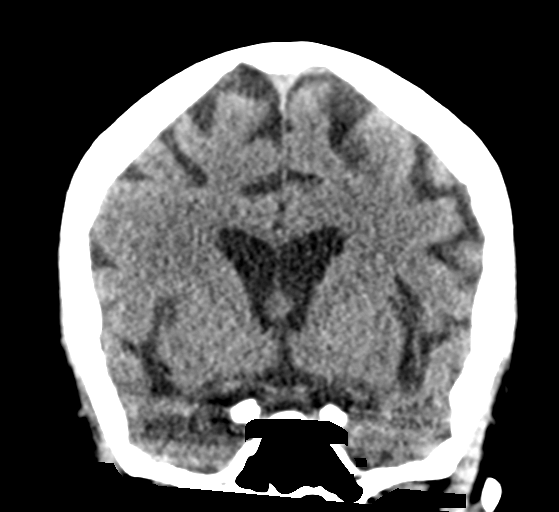
[im 37/67  brain]
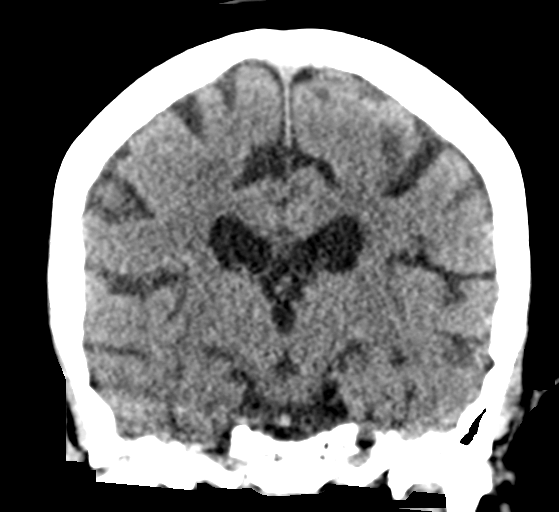

[Series 5: sagittal soft tissue · sagittal · 0.28mm/px · 3 of 48 slices shown]
[im 16/48  brain]
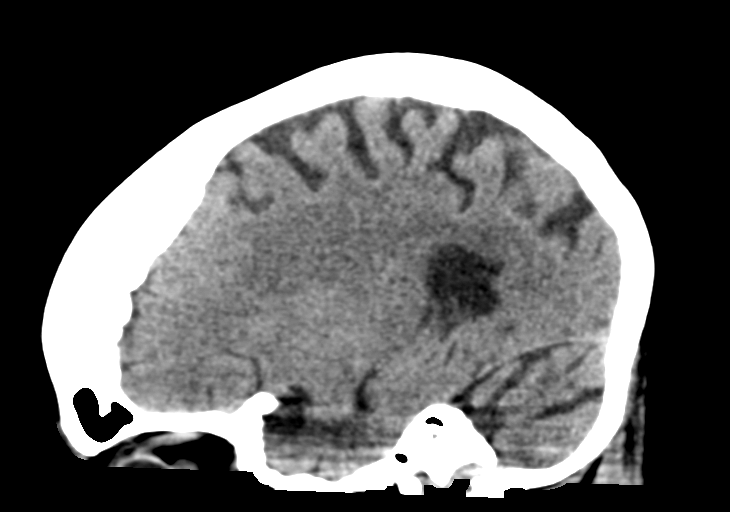
[im 24/48  brain]
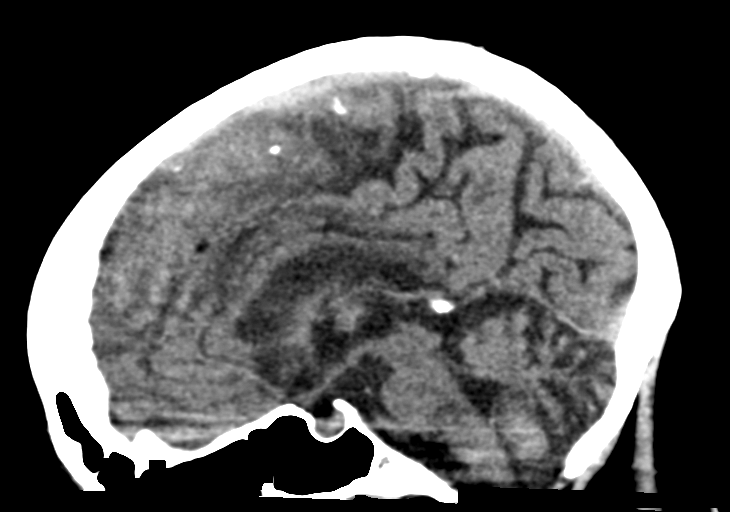
[im 32/48  brain]
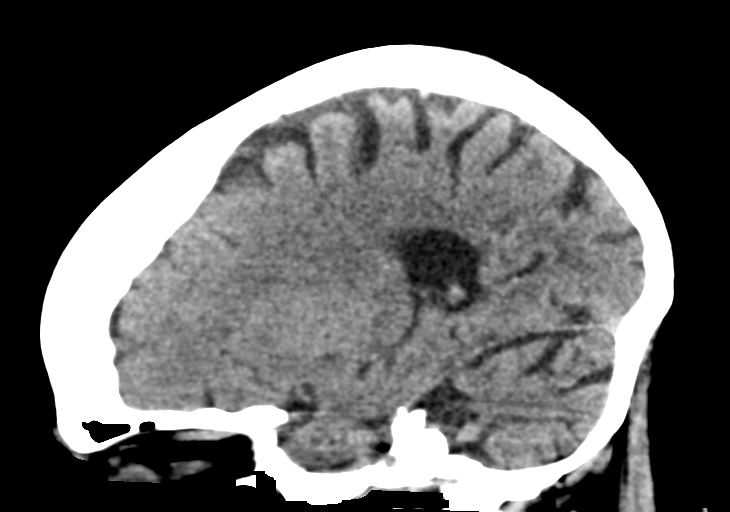

[15 of 45 positions shown; findings below may reference images not displayed]

FINDINGS: Brain: No acute intracranial hemorrhage. No midline shift or mass
effect. Gray-white differentiation maintained. Unremarkable
appearance of the ventricular system. Volume loss of cerebellum.
This finding is incidental, and has been described with chronic
Depakote prescription.

Vascular: Unremarkable.

Skull: No acute fracture.  No aggressive bone lesion identified.

Sinuses/Orbits: Unremarkable appearance of the orbits. Mastoid air
cells clear. No middle ear effusion. No significant sinus disease.

Other: None
IMPRESSION: No CT evidence of acute intracranial abnormality.

Preferential volume loss of the cerebellum, which has been described
with longstanding Depakote prescription.
